# Patient Record
Sex: Male | Born: 1965 | Race: White | Hispanic: No | Smoking: Current every day smoker
Health system: Southern US, Community
[De-identification: ages and names within clinical notes are randomized; demographics above are authoritative.]

## PROBLEM LIST (undated history)

## (undated) DIAGNOSIS — K222 Esophageal obstruction: Secondary | ICD-10-CM

## (undated) DIAGNOSIS — Z860100 Personal history of colon polyps, unspecified: Secondary | ICD-10-CM

## (undated) DIAGNOSIS — B354 Tinea corporis: Secondary | ICD-10-CM

## (undated) DIAGNOSIS — IMO0002 Reserved for concepts with insufficient information to code with codable children: Secondary | ICD-10-CM

## (undated) DIAGNOSIS — R1032 Left lower quadrant pain: Secondary | ICD-10-CM

## (undated) DIAGNOSIS — F1120 Opioid dependence, uncomplicated: Secondary | ICD-10-CM

## (undated) DIAGNOSIS — K219 Gastro-esophageal reflux disease without esophagitis: Secondary | ICD-10-CM

## (undated) DIAGNOSIS — N400 Enlarged prostate without lower urinary tract symptoms: Secondary | ICD-10-CM

## (undated) DIAGNOSIS — N138 Other obstructive and reflux uropathy: Secondary | ICD-10-CM

## (undated) DIAGNOSIS — M545 Low back pain, unspecified: Secondary | ICD-10-CM

## (undated) DIAGNOSIS — G8929 Other chronic pain: Secondary | ICD-10-CM

## (undated) DIAGNOSIS — L0293 Carbuncle, unspecified: Secondary | ICD-10-CM

## (undated) DIAGNOSIS — Z83719 Family history of colon polyps, unspecified: Secondary | ICD-10-CM

## (undated) DIAGNOSIS — K2270 Barrett's esophagus without dysplasia: Secondary | ICD-10-CM

## (undated) DIAGNOSIS — K209 Esophagitis, unspecified without bleeding: Secondary | ICD-10-CM

## (undated) DIAGNOSIS — A09 Infectious gastroenteritis and colitis, unspecified: Secondary | ICD-10-CM

## (undated) DIAGNOSIS — R112 Nausea with vomiting, unspecified: Secondary | ICD-10-CM

## (undated) DIAGNOSIS — Z8371 Family history of colonic polyps: Secondary | ICD-10-CM

## (undated) DIAGNOSIS — B1710 Acute hepatitis C without hepatic coma: Secondary | ICD-10-CM

## (undated) DIAGNOSIS — F411 Generalized anxiety disorder: Secondary | ICD-10-CM

## (undated) DIAGNOSIS — I10 Essential (primary) hypertension: Secondary | ICD-10-CM

## (undated) DIAGNOSIS — J9383 Other pneumothorax: Secondary | ICD-10-CM

## (undated) DIAGNOSIS — R110 Nausea: Secondary | ICD-10-CM

## (undated) DIAGNOSIS — M1990 Unspecified osteoarthritis, unspecified site: Secondary | ICD-10-CM

## (undated) DIAGNOSIS — Z9289 Personal history of other medical treatment: Secondary | ICD-10-CM

## (undated) DIAGNOSIS — K2980 Duodenitis without bleeding: Secondary | ICD-10-CM

## (undated) DIAGNOSIS — F4310 Post-traumatic stress disorder, unspecified: Secondary | ICD-10-CM

## (undated) DIAGNOSIS — F1910 Other psychoactive substance abuse, uncomplicated: Secondary | ICD-10-CM

## (undated) DIAGNOSIS — R197 Diarrhea, unspecified: Secondary | ICD-10-CM

## (undated) DIAGNOSIS — N401 Enlarged prostate with lower urinary tract symptoms: Secondary | ICD-10-CM

## (undated) DIAGNOSIS — T148XXA Other injury of unspecified body region, initial encounter: Secondary | ICD-10-CM

## (undated) DIAGNOSIS — L0292 Furuncle, unspecified: Secondary | ICD-10-CM

## (undated) HISTORY — DX: Esophagitis, unspecified: K20.9

## (undated) HISTORY — DX: Benign prostatic hyperplasia with lower urinary tract symptoms: N40.1

## (undated) HISTORY — PX: REPAIR DURAL / CSF LEAK: SUR1169

## (undated) HISTORY — PX: LUMBAR DISC SURGERY: SHX700

## (undated) HISTORY — DX: Other obstructive and reflux uropathy: N13.8

## (undated) HISTORY — DX: Nausea: R11.0

## (undated) HISTORY — DX: Generalized anxiety disorder: F41.1

## (undated) HISTORY — PX: TONSILLECTOMY AND ADENOIDECTOMY: SUR1326

## (undated) HISTORY — DX: Left lower quadrant pain: R10.32

## (undated) HISTORY — PX: BACK SURGERY: SHX140

## (undated) HISTORY — DX: Infectious gastroenteritis and colitis, unspecified: A09

## (undated) HISTORY — DX: Esophageal obstruction: K22.2

## (undated) HISTORY — DX: Gastro-esophageal reflux disease without esophagitis: K21.9

## (undated) HISTORY — DX: Other psychoactive substance abuse, uncomplicated: F19.10

## (undated) HISTORY — DX: Duodenitis without bleeding: K29.80

## (undated) HISTORY — DX: Family history of colon polyps, unspecified: Z83.719

## (undated) HISTORY — DX: Benign prostatic hyperplasia without lower urinary tract symptoms: N40.0

## (undated) HISTORY — DX: Personal history of colonic polyps: Z86.010

## (undated) HISTORY — DX: Reserved for concepts with insufficient information to code with codable children: IMO0002

## (undated) HISTORY — DX: Acute hepatitis C without hepatic coma: B17.10

## (undated) HISTORY — DX: Nausea with vomiting, unspecified: R11.2

## (undated) HISTORY — DX: Carbuncle, unspecified: L02.93

## (undated) HISTORY — DX: Diarrhea, unspecified: R19.7

## (undated) HISTORY — DX: Esophagitis, unspecified without bleeding: K20.90

## (undated) HISTORY — DX: Furuncle, unspecified: L02.92

## (undated) HISTORY — PX: COLON SURGERY: SHX602

## (undated) HISTORY — DX: Tinea corporis: B35.4

## (undated) HISTORY — DX: Personal history of colon polyps, unspecified: Z86.0100

## (undated) HISTORY — DX: Barrett's esophagus without dysplasia: K22.70

## (undated) HISTORY — PX: FRACTURE SURGERY: SHX138

## (undated) HISTORY — DX: Family history of colonic polyps: Z83.71

---

## 1986-09-27 HISTORY — PX: RESECTION OF APICAL BLEB: SHX5078

## 1987-09-28 DIAGNOSIS — B1710 Acute hepatitis C without hepatic coma: Secondary | ICD-10-CM

## 1987-09-28 DIAGNOSIS — Z9289 Personal history of other medical treatment: Secondary | ICD-10-CM

## 1987-09-28 HISTORY — PX: EXPLORATORY LAPAROTOMY: SUR591

## 1987-09-28 HISTORY — DX: Personal history of other medical treatment: Z92.89

## 1987-09-28 HISTORY — PX: APPENDECTOMY: SHX54

## 1987-09-28 HISTORY — DX: Acute hepatitis C without hepatic coma: B17.10

## 1988-09-27 DIAGNOSIS — T148XXA Other injury of unspecified body region, initial encounter: Secondary | ICD-10-CM

## 1988-09-27 HISTORY — DX: Other injury of unspecified body region, initial encounter: T14.8XXA

## 1989-09-27 HISTORY — PX: ORIF TIBIAL SHAFT FRACTURE W/ PLATES AND SCREWS: SUR964

## 1998-02-22 ENCOUNTER — Emergency Department (HOSPITAL_COMMUNITY): Admission: EM | Admit: 1998-02-22 | Discharge: 1998-02-22 | Payer: Self-pay | Admitting: Emergency Medicine

## 1999-02-08 ENCOUNTER — Encounter: Payer: Self-pay | Admitting: *Deleted

## 1999-02-08 ENCOUNTER — Ambulatory Visit (HOSPITAL_COMMUNITY): Admission: RE | Admit: 1999-02-08 | Discharge: 1999-02-08 | Payer: Self-pay | Admitting: *Deleted

## 1999-02-26 ENCOUNTER — Emergency Department (HOSPITAL_COMMUNITY): Admission: EM | Admit: 1999-02-26 | Discharge: 1999-02-26 | Payer: Self-pay | Admitting: Emergency Medicine

## 1999-04-07 ENCOUNTER — Emergency Department (HOSPITAL_COMMUNITY): Admission: EM | Admit: 1999-04-07 | Discharge: 1999-04-07 | Payer: Self-pay | Admitting: Emergency Medicine

## 1999-04-07 ENCOUNTER — Encounter: Payer: Self-pay | Admitting: Emergency Medicine

## 1999-07-27 ENCOUNTER — Emergency Department (HOSPITAL_COMMUNITY): Admission: EM | Admit: 1999-07-27 | Discharge: 1999-07-27 | Payer: Self-pay | Admitting: Emergency Medicine

## 1999-07-27 ENCOUNTER — Encounter: Payer: Self-pay | Admitting: Emergency Medicine

## 1999-08-30 ENCOUNTER — Encounter: Payer: Self-pay | Admitting: Emergency Medicine

## 1999-08-30 ENCOUNTER — Emergency Department (HOSPITAL_COMMUNITY): Admission: EM | Admit: 1999-08-30 | Discharge: 1999-08-30 | Payer: Self-pay | Admitting: Emergency Medicine

## 1999-09-07 ENCOUNTER — Encounter: Payer: Self-pay | Admitting: Emergency Medicine

## 1999-09-07 ENCOUNTER — Emergency Department (HOSPITAL_COMMUNITY): Admission: EM | Admit: 1999-09-07 | Discharge: 1999-09-07 | Payer: Self-pay | Admitting: Emergency Medicine

## 1999-09-09 ENCOUNTER — Ambulatory Visit (HOSPITAL_COMMUNITY): Admission: RE | Admit: 1999-09-09 | Discharge: 1999-09-09 | Payer: Self-pay

## 2000-01-21 ENCOUNTER — Emergency Department (HOSPITAL_COMMUNITY): Admission: EM | Admit: 2000-01-21 | Discharge: 2000-01-21 | Payer: Self-pay | Admitting: Emergency Medicine

## 2000-01-21 ENCOUNTER — Encounter: Payer: Self-pay | Admitting: Emergency Medicine

## 2000-02-28 ENCOUNTER — Emergency Department (HOSPITAL_COMMUNITY): Admission: EM | Admit: 2000-02-28 | Discharge: 2000-02-28 | Payer: Self-pay | Admitting: Emergency Medicine

## 2000-02-28 ENCOUNTER — Encounter: Payer: Self-pay | Admitting: Emergency Medicine

## 2000-05-26 ENCOUNTER — Ambulatory Visit (HOSPITAL_COMMUNITY): Admission: RE | Admit: 2000-05-26 | Discharge: 2000-05-26 | Payer: Self-pay | Admitting: *Deleted

## 2000-05-26 ENCOUNTER — Encounter: Payer: Self-pay | Admitting: *Deleted

## 2000-06-26 ENCOUNTER — Emergency Department (HOSPITAL_COMMUNITY): Admission: EM | Admit: 2000-06-26 | Discharge: 2000-06-27 | Payer: Self-pay | Admitting: Emergency Medicine

## 2000-08-07 ENCOUNTER — Inpatient Hospital Stay (HOSPITAL_COMMUNITY): Admission: EM | Admit: 2000-08-07 | Discharge: 2000-08-08 | Payer: Self-pay | Admitting: Emergency Medicine

## 2000-08-07 ENCOUNTER — Encounter: Payer: Self-pay | Admitting: Emergency Medicine

## 2000-09-05 ENCOUNTER — Encounter: Payer: Self-pay | Admitting: *Deleted

## 2000-09-05 ENCOUNTER — Ambulatory Visit (HOSPITAL_COMMUNITY): Admission: RE | Admit: 2000-09-05 | Discharge: 2000-09-05 | Payer: Self-pay | Admitting: *Deleted

## 2000-09-18 ENCOUNTER — Emergency Department (HOSPITAL_COMMUNITY): Admission: EM | Admit: 2000-09-18 | Discharge: 2000-09-18 | Payer: Self-pay | Admitting: Emergency Medicine

## 2001-01-24 ENCOUNTER — Emergency Department (HOSPITAL_COMMUNITY): Admission: EM | Admit: 2001-01-24 | Discharge: 2001-01-24 | Payer: Self-pay | Admitting: *Deleted

## 2001-04-22 ENCOUNTER — Emergency Department (HOSPITAL_COMMUNITY): Admission: EM | Admit: 2001-04-22 | Discharge: 2001-04-22 | Payer: Self-pay

## 2001-04-23 ENCOUNTER — Emergency Department (HOSPITAL_COMMUNITY): Admission: EM | Admit: 2001-04-23 | Discharge: 2001-04-23 | Payer: Self-pay | Admitting: Emergency Medicine

## 2001-04-30 ENCOUNTER — Emergency Department (HOSPITAL_COMMUNITY): Admission: EM | Admit: 2001-04-30 | Discharge: 2001-05-01 | Payer: Self-pay

## 2001-06-25 ENCOUNTER — Ambulatory Visit (HOSPITAL_COMMUNITY): Admission: RE | Admit: 2001-06-25 | Discharge: 2001-06-25 | Payer: Self-pay | Admitting: *Deleted

## 2001-06-25 ENCOUNTER — Encounter: Payer: Self-pay | Admitting: *Deleted

## 2001-10-01 ENCOUNTER — Ambulatory Visit (HOSPITAL_COMMUNITY): Admission: RE | Admit: 2001-10-01 | Discharge: 2001-10-01 | Payer: Self-pay | Admitting: *Deleted

## 2001-10-01 ENCOUNTER — Encounter: Payer: Self-pay | Admitting: *Deleted

## 2002-02-28 ENCOUNTER — Inpatient Hospital Stay (HOSPITAL_COMMUNITY): Admission: AD | Admit: 2002-02-28 | Discharge: 2002-03-04 | Payer: Self-pay | Admitting: Orthopedic Surgery

## 2002-02-28 ENCOUNTER — Emergency Department (HOSPITAL_COMMUNITY): Admission: EM | Admit: 2002-02-28 | Discharge: 2002-02-28 | Payer: Self-pay | Admitting: Emergency Medicine

## 2002-02-28 ENCOUNTER — Encounter: Payer: Self-pay | Admitting: Internal Medicine

## 2002-03-25 ENCOUNTER — Encounter: Payer: Self-pay | Admitting: Emergency Medicine

## 2002-03-25 ENCOUNTER — Emergency Department (HOSPITAL_COMMUNITY): Admission: EM | Admit: 2002-03-25 | Discharge: 2002-03-25 | Payer: Self-pay | Admitting: Emergency Medicine

## 2002-04-08 ENCOUNTER — Emergency Department (HOSPITAL_COMMUNITY): Admission: EM | Admit: 2002-04-08 | Discharge: 2002-04-08 | Payer: Self-pay | Admitting: Emergency Medicine

## 2002-04-08 ENCOUNTER — Encounter: Payer: Self-pay | Admitting: Emergency Medicine

## 2002-04-21 ENCOUNTER — Emergency Department (HOSPITAL_COMMUNITY): Admission: EM | Admit: 2002-04-21 | Discharge: 2002-04-21 | Payer: Self-pay | Admitting: Emergency Medicine

## 2002-04-21 ENCOUNTER — Encounter: Payer: Self-pay | Admitting: Emergency Medicine

## 2002-04-28 ENCOUNTER — Emergency Department (HOSPITAL_COMMUNITY): Admission: EM | Admit: 2002-04-28 | Discharge: 2002-04-28 | Payer: Self-pay | Admitting: Emergency Medicine

## 2002-04-28 ENCOUNTER — Encounter: Payer: Self-pay | Admitting: Emergency Medicine

## 2002-04-30 ENCOUNTER — Emergency Department (HOSPITAL_COMMUNITY): Admission: EM | Admit: 2002-04-30 | Discharge: 2002-05-01 | Payer: Self-pay | Admitting: Emergency Medicine

## 2002-04-30 ENCOUNTER — Emergency Department (HOSPITAL_COMMUNITY): Admission: EM | Admit: 2002-04-30 | Discharge: 2002-04-30 | Payer: Self-pay | Admitting: Emergency Medicine

## 2002-08-27 ENCOUNTER — Encounter: Payer: Self-pay | Admitting: Family Medicine

## 2002-08-27 ENCOUNTER — Encounter: Admission: RE | Admit: 2002-08-27 | Discharge: 2002-08-27 | Payer: Self-pay | Admitting: Family Medicine

## 2003-01-21 ENCOUNTER — Emergency Department (HOSPITAL_COMMUNITY): Admission: EM | Admit: 2003-01-21 | Discharge: 2003-01-21 | Payer: Self-pay | Admitting: Emergency Medicine

## 2003-02-21 ENCOUNTER — Encounter: Admission: RE | Admit: 2003-02-21 | Discharge: 2003-03-08 | Payer: Self-pay | Admitting: Pediatrics

## 2003-10-08 ENCOUNTER — Emergency Department (HOSPITAL_COMMUNITY): Admission: EM | Admit: 2003-10-08 | Discharge: 2003-10-08 | Payer: Self-pay

## 2003-11-23 ENCOUNTER — Emergency Department (HOSPITAL_COMMUNITY): Admission: EM | Admit: 2003-11-23 | Discharge: 2003-11-23 | Payer: Self-pay | Admitting: Emergency Medicine

## 2004-05-02 ENCOUNTER — Emergency Department (HOSPITAL_COMMUNITY): Admission: EM | Admit: 2004-05-02 | Discharge: 2004-05-02 | Payer: Self-pay | Admitting: Emergency Medicine

## 2004-05-04 ENCOUNTER — Emergency Department (HOSPITAL_COMMUNITY): Admission: EM | Admit: 2004-05-04 | Discharge: 2004-05-04 | Payer: Self-pay | Admitting: Emergency Medicine

## 2004-05-14 ENCOUNTER — Emergency Department (HOSPITAL_COMMUNITY): Admission: EM | Admit: 2004-05-14 | Discharge: 2004-05-14 | Payer: Self-pay | Admitting: Emergency Medicine

## 2004-09-17 ENCOUNTER — Emergency Department (HOSPITAL_COMMUNITY): Admission: EM | Admit: 2004-09-17 | Discharge: 2004-09-18 | Payer: Self-pay | Admitting: Emergency Medicine

## 2004-09-18 ENCOUNTER — Emergency Department (HOSPITAL_COMMUNITY): Admission: EM | Admit: 2004-09-18 | Discharge: 2004-09-18 | Payer: Self-pay

## 2004-09-19 ENCOUNTER — Inpatient Hospital Stay (HOSPITAL_COMMUNITY): Admission: EM | Admit: 2004-09-19 | Discharge: 2004-09-23 | Payer: Self-pay

## 2004-09-25 ENCOUNTER — Emergency Department (HOSPITAL_COMMUNITY): Admission: EM | Admit: 2004-09-25 | Discharge: 2004-09-25 | Payer: Self-pay | Admitting: Emergency Medicine

## 2004-12-15 ENCOUNTER — Ambulatory Visit: Payer: Self-pay | Admitting: Internal Medicine

## 2005-01-04 ENCOUNTER — Ambulatory Visit (HOSPITAL_COMMUNITY): Admission: RE | Admit: 2005-01-04 | Discharge: 2005-01-04 | Payer: Self-pay | Admitting: Obstetrics and Gynecology

## 2005-01-04 ENCOUNTER — Encounter (INDEPENDENT_AMBULATORY_CARE_PROVIDER_SITE_OTHER): Payer: Self-pay | Admitting: *Deleted

## 2005-04-07 ENCOUNTER — Emergency Department (HOSPITAL_COMMUNITY): Admission: EM | Admit: 2005-04-07 | Discharge: 2005-04-07 | Payer: Self-pay | Admitting: Emergency Medicine

## 2005-04-08 ENCOUNTER — Emergency Department (HOSPITAL_COMMUNITY): Admission: EM | Admit: 2005-04-08 | Discharge: 2005-04-08 | Payer: Self-pay | Admitting: Emergency Medicine

## 2005-04-09 ENCOUNTER — Emergency Department (HOSPITAL_COMMUNITY): Admission: EM | Admit: 2005-04-09 | Discharge: 2005-04-10 | Payer: Self-pay | Admitting: Emergency Medicine

## 2005-04-11 ENCOUNTER — Emergency Department (HOSPITAL_COMMUNITY): Admission: EM | Admit: 2005-04-11 | Discharge: 2005-04-11 | Payer: Self-pay | Admitting: *Deleted

## 2005-04-15 ENCOUNTER — Emergency Department (HOSPITAL_COMMUNITY): Admission: EM | Admit: 2005-04-15 | Discharge: 2005-04-15 | Payer: Self-pay | Admitting: Emergency Medicine

## 2005-07-14 ENCOUNTER — Emergency Department (HOSPITAL_COMMUNITY): Admission: EM | Admit: 2005-07-14 | Discharge: 2005-07-14 | Payer: Self-pay | Admitting: Emergency Medicine

## 2005-09-06 ENCOUNTER — Emergency Department (HOSPITAL_COMMUNITY): Admission: EM | Admit: 2005-09-06 | Discharge: 2005-09-06 | Payer: Self-pay | Admitting: Emergency Medicine

## 2005-11-18 ENCOUNTER — Emergency Department (HOSPITAL_COMMUNITY): Admission: EM | Admit: 2005-11-18 | Discharge: 2005-11-18 | Payer: Self-pay | Admitting: Emergency Medicine

## 2006-01-05 ENCOUNTER — Ambulatory Visit: Payer: Self-pay | Admitting: Internal Medicine

## 2006-02-16 ENCOUNTER — Ambulatory Visit: Payer: Self-pay | Admitting: Infectious Diseases

## 2006-03-27 ENCOUNTER — Emergency Department (HOSPITAL_COMMUNITY): Admission: EM | Admit: 2006-03-27 | Discharge: 2006-03-27 | Payer: Self-pay | Admitting: Emergency Medicine

## 2006-04-05 ENCOUNTER — Ambulatory Visit: Payer: Self-pay | Admitting: Internal Medicine

## 2006-04-16 ENCOUNTER — Encounter: Payer: Self-pay | Admitting: Emergency Medicine

## 2006-04-17 ENCOUNTER — Observation Stay (HOSPITAL_COMMUNITY): Admission: EM | Admit: 2006-04-17 | Discharge: 2006-04-17 | Payer: Self-pay | Admitting: Internal Medicine

## 2006-04-19 ENCOUNTER — Ambulatory Visit: Payer: Self-pay | Admitting: Internal Medicine

## 2006-06-07 ENCOUNTER — Ambulatory Visit: Payer: Self-pay | Admitting: Internal Medicine

## 2006-07-12 ENCOUNTER — Ambulatory Visit: Payer: Self-pay | Admitting: Infectious Diseases

## 2006-07-12 LAB — CONVERTED CEMR LAB
Albumin: 4.7 g/dL (ref 3.5–5.2)
Creatinine, Ser: 0.79 mg/dL (ref 0.40–1.50)
Eosinophils Absolute: 0.3 cells/mcL (ref 0.0–0.7)
Glucose, Bld: 73 mg/dL (ref 70–99)
Leukocyte count, blood: 6.5 10*9/L (ref 4.0–10.5)
Lymphocytes Relative: 38 % (ref 12–46)
Lymphs Abs: 2.5 10*3/uL (ref 0.7–3.3)
MCV: 92.3 fL (ref 78.0–100.0)
Monocytes Relative: 10 % (ref 3–11)
Platelets: 234 10*3/uL (ref 150–400)
RBC: 5.06 M/uL (ref 4.22–5.81)
Sodium: 140 meq/L (ref 135–145)
Total Bilirubin: 0.4 mg/dL (ref 0.3–1.2)

## 2006-07-19 ENCOUNTER — Ambulatory Visit: Payer: Self-pay | Admitting: Internal Medicine

## 2006-08-01 DIAGNOSIS — B354 Tinea corporis: Secondary | ICD-10-CM | POA: Insufficient documentation

## 2006-08-01 DIAGNOSIS — L0292 Furuncle, unspecified: Secondary | ICD-10-CM | POA: Insufficient documentation

## 2006-08-01 DIAGNOSIS — L0293 Carbuncle, unspecified: Secondary | ICD-10-CM

## 2006-08-07 ENCOUNTER — Emergency Department (HOSPITAL_COMMUNITY): Admission: EM | Admit: 2006-08-07 | Discharge: 2006-08-07 | Payer: Self-pay | Admitting: Family Medicine

## 2006-08-09 ENCOUNTER — Emergency Department (HOSPITAL_COMMUNITY): Admission: EM | Admit: 2006-08-09 | Discharge: 2006-08-09 | Payer: Self-pay | Admitting: Family Medicine

## 2006-10-10 ENCOUNTER — Emergency Department (HOSPITAL_COMMUNITY): Admission: EM | Admit: 2006-10-10 | Discharge: 2006-10-10 | Payer: Self-pay | Admitting: Family Medicine

## 2006-12-08 ENCOUNTER — Ambulatory Visit: Payer: Self-pay | Admitting: Gastroenterology

## 2006-12-08 ENCOUNTER — Telehealth: Payer: Self-pay | Admitting: Infectious Diseases

## 2006-12-22 ENCOUNTER — Ambulatory Visit (HOSPITAL_COMMUNITY): Admission: RE | Admit: 2006-12-22 | Discharge: 2006-12-22 | Payer: Self-pay | Admitting: Gastroenterology

## 2006-12-22 ENCOUNTER — Encounter: Payer: Self-pay | Admitting: Gastroenterology

## 2006-12-22 DIAGNOSIS — K222 Esophageal obstruction: Secondary | ICD-10-CM

## 2006-12-22 DIAGNOSIS — K209 Esophagitis, unspecified without bleeding: Secondary | ICD-10-CM | POA: Insufficient documentation

## 2006-12-22 DIAGNOSIS — K2980 Duodenitis without bleeding: Secondary | ICD-10-CM | POA: Insufficient documentation

## 2006-12-27 ENCOUNTER — Ambulatory Visit: Payer: Self-pay | Admitting: Gastroenterology

## 2007-03-11 ENCOUNTER — Emergency Department (HOSPITAL_COMMUNITY): Admission: EM | Admit: 2007-03-11 | Discharge: 2007-03-11 | Payer: Self-pay | Admitting: Emergency Medicine

## 2007-03-13 ENCOUNTER — Emergency Department (HOSPITAL_COMMUNITY): Admission: EM | Admit: 2007-03-13 | Discharge: 2007-03-13 | Payer: Self-pay | Admitting: Emergency Medicine

## 2007-04-03 ENCOUNTER — Emergency Department (HOSPITAL_COMMUNITY): Admission: EM | Admit: 2007-04-03 | Discharge: 2007-04-03 | Payer: Self-pay | Admitting: Emergency Medicine

## 2007-04-05 ENCOUNTER — Emergency Department (HOSPITAL_COMMUNITY): Admission: EM | Admit: 2007-04-05 | Discharge: 2007-04-05 | Payer: Self-pay | Admitting: Emergency Medicine

## 2007-05-16 ENCOUNTER — Ambulatory Visit (HOSPITAL_BASED_OUTPATIENT_CLINIC_OR_DEPARTMENT_OTHER): Admission: RE | Admit: 2007-05-16 | Discharge: 2007-05-16 | Payer: Self-pay | Admitting: Orthopedic Surgery

## 2007-08-03 ENCOUNTER — Ambulatory Visit: Payer: Self-pay | Admitting: Gastroenterology

## 2007-10-03 ENCOUNTER — Ambulatory Visit: Payer: Self-pay | Admitting: Gastroenterology

## 2007-10-06 ENCOUNTER — Ambulatory Visit: Payer: Self-pay | Admitting: Gastroenterology

## 2007-12-26 ENCOUNTER — Ambulatory Visit: Payer: Self-pay | Admitting: Gastroenterology

## 2008-02-01 ENCOUNTER — Encounter: Admission: RE | Admit: 2008-02-01 | Discharge: 2008-03-04 | Payer: Self-pay | Admitting: Anesthesiology

## 2008-03-19 ENCOUNTER — Encounter
Admission: RE | Admit: 2008-03-19 | Discharge: 2008-06-17 | Payer: Self-pay | Admitting: Physical Medicine & Rehabilitation

## 2008-03-22 ENCOUNTER — Ambulatory Visit: Payer: Self-pay | Admitting: Physical Medicine & Rehabilitation

## 2008-03-26 ENCOUNTER — Emergency Department (HOSPITAL_COMMUNITY): Admission: EM | Admit: 2008-03-26 | Discharge: 2008-03-26 | Payer: Self-pay | Admitting: Emergency Medicine

## 2008-04-08 ENCOUNTER — Telehealth: Payer: Self-pay | Admitting: Gastroenterology

## 2008-04-19 ENCOUNTER — Ambulatory Visit: Payer: Self-pay | Admitting: Physical Medicine & Rehabilitation

## 2008-04-30 ENCOUNTER — Encounter: Admission: RE | Admit: 2008-04-30 | Discharge: 2008-04-30 | Payer: Self-pay | Admitting: Internal Medicine

## 2008-05-07 ENCOUNTER — Telehealth: Payer: Self-pay | Admitting: Gastroenterology

## 2008-05-17 ENCOUNTER — Ambulatory Visit: Payer: Self-pay | Admitting: Physical Medicine & Rehabilitation

## 2008-05-31 ENCOUNTER — Ambulatory Visit: Payer: Self-pay | Admitting: Physical Medicine & Rehabilitation

## 2008-06-11 ENCOUNTER — Telehealth: Payer: Self-pay | Admitting: Gastroenterology

## 2008-06-18 ENCOUNTER — Telehealth: Payer: Self-pay | Admitting: Gastroenterology

## 2008-06-27 ENCOUNTER — Encounter
Admission: RE | Admit: 2008-06-27 | Discharge: 2008-09-25 | Payer: Self-pay | Admitting: Physical Medicine & Rehabilitation

## 2008-06-28 ENCOUNTER — Ambulatory Visit: Payer: Self-pay | Admitting: Physical Medicine & Rehabilitation

## 2008-07-02 ENCOUNTER — Ambulatory Visit: Payer: Self-pay | Admitting: Gastroenterology

## 2008-07-26 ENCOUNTER — Ambulatory Visit: Payer: Self-pay | Admitting: Physical Medicine & Rehabilitation

## 2008-08-07 ENCOUNTER — Ambulatory Visit: Payer: Self-pay | Admitting: Pain Medicine

## 2008-08-13 ENCOUNTER — Emergency Department (HOSPITAL_COMMUNITY): Admission: EM | Admit: 2008-08-13 | Discharge: 2008-08-13 | Payer: Self-pay | Admitting: Emergency Medicine

## 2008-08-20 ENCOUNTER — Ambulatory Visit: Payer: Self-pay | Admitting: Physical Medicine & Rehabilitation

## 2008-09-17 ENCOUNTER — Ambulatory Visit: Payer: Self-pay | Admitting: Physical Medicine & Rehabilitation

## 2008-10-14 ENCOUNTER — Encounter
Admission: RE | Admit: 2008-10-14 | Discharge: 2009-01-12 | Payer: Self-pay | Admitting: Physical Medicine & Rehabilitation

## 2008-10-15 ENCOUNTER — Ambulatory Visit: Payer: Self-pay | Admitting: Physical Medicine & Rehabilitation

## 2008-10-15 ENCOUNTER — Telehealth: Payer: Self-pay | Admitting: Gastroenterology

## 2008-11-15 ENCOUNTER — Telehealth: Payer: Self-pay | Admitting: Gastroenterology

## 2008-11-29 ENCOUNTER — Telehealth: Payer: Self-pay | Admitting: Gastroenterology

## 2008-12-11 DIAGNOSIS — B1710 Acute hepatitis C without hepatic coma: Secondary | ICD-10-CM | POA: Insufficient documentation

## 2008-12-13 ENCOUNTER — Ambulatory Visit: Payer: Self-pay | Admitting: Gastroenterology

## 2008-12-13 DIAGNOSIS — K219 Gastro-esophageal reflux disease without esophagitis: Secondary | ICD-10-CM | POA: Insufficient documentation

## 2008-12-18 ENCOUNTER — Emergency Department (HOSPITAL_COMMUNITY): Admission: EM | Admit: 2008-12-18 | Discharge: 2008-12-18 | Payer: Self-pay | Admitting: Emergency Medicine

## 2008-12-19 ENCOUNTER — Telehealth (INDEPENDENT_AMBULATORY_CARE_PROVIDER_SITE_OTHER): Payer: Self-pay | Admitting: *Deleted

## 2008-12-19 ENCOUNTER — Emergency Department (HOSPITAL_COMMUNITY): Admission: EM | Admit: 2008-12-19 | Discharge: 2008-12-19 | Payer: Self-pay | Admitting: Emergency Medicine

## 2009-01-01 ENCOUNTER — Telehealth: Payer: Self-pay | Admitting: Gastroenterology

## 2009-04-16 ENCOUNTER — Telehealth: Payer: Self-pay | Admitting: Gastroenterology

## 2009-06-03 ENCOUNTER — Ambulatory Visit: Payer: Self-pay | Admitting: Gastroenterology

## 2009-06-03 ENCOUNTER — Telehealth: Payer: Self-pay | Admitting: Gastroenterology

## 2009-06-03 ENCOUNTER — Ambulatory Visit: Payer: Self-pay | Admitting: Cardiology

## 2009-06-03 DIAGNOSIS — R112 Nausea with vomiting, unspecified: Secondary | ICD-10-CM

## 2009-06-03 DIAGNOSIS — R1032 Left lower quadrant pain: Secondary | ICD-10-CM | POA: Insufficient documentation

## 2009-06-03 DIAGNOSIS — R197 Diarrhea, unspecified: Secondary | ICD-10-CM

## 2009-06-03 DIAGNOSIS — F411 Generalized anxiety disorder: Secondary | ICD-10-CM | POA: Insufficient documentation

## 2009-06-03 DIAGNOSIS — R110 Nausea: Secondary | ICD-10-CM

## 2009-06-03 DIAGNOSIS — A09 Infectious gastroenteritis and colitis, unspecified: Secondary | ICD-10-CM | POA: Insufficient documentation

## 2009-06-04 LAB — CONVERTED CEMR LAB
ALT: 60 units/L — ABNORMAL HIGH (ref 0–53)
AST: 59 units/L — ABNORMAL HIGH (ref 0–37)
CO2: 28 meq/L (ref 19–32)
Calcium: 9 mg/dL (ref 8.4–10.5)
Chloride: 107 meq/L (ref 96–112)
Creatinine, Ser: 0.8 mg/dL (ref 0.4–1.5)
Eosinophils Absolute: 0.4 10*3/uL (ref 0.0–0.7)
GFR calc non Af Amer: 111.69 mL/min (ref >60)
Glucose, Bld: 83 mg/dL (ref 70–99)
HCT: 35.6 % — ABNORMAL LOW (ref 39.0–52.0)
Hemoglobin, Urine: NEGATIVE
Hemoglobin: 12.3 g/dL — ABNORMAL LOW (ref 13.0–17.0)
Lymphs Abs: 1.9 10*3/uL (ref 0.7–4.0)
MCV: 97.5 fL (ref 78.0–100.0)
Monocytes Absolute: 0.6 10*3/uL (ref 0.1–1.0)
Neutrophils Relative %: 46 % (ref 43.0–77.0)
Potassium: 4.3 meq/L (ref 3.5–5.1)
RDW: 12.2 % (ref 11.5–14.6)
Total Bilirubin: 0.5 mg/dL (ref 0.3–1.2)
pH: 6 (ref 5.0–8.0)

## 2009-06-16 ENCOUNTER — Ambulatory Visit: Payer: Self-pay | Admitting: Gastroenterology

## 2009-06-19 ENCOUNTER — Emergency Department (HOSPITAL_COMMUNITY): Admission: EM | Admit: 2009-06-19 | Discharge: 2009-06-19 | Payer: Self-pay | Admitting: Emergency Medicine

## 2009-10-06 ENCOUNTER — Telehealth: Payer: Self-pay | Admitting: Gastroenterology

## 2009-11-03 ENCOUNTER — Ambulatory Visit: Payer: Self-pay | Admitting: Gastroenterology

## 2009-11-03 DIAGNOSIS — N400 Enlarged prostate without lower urinary tract symptoms: Secondary | ICD-10-CM

## 2009-11-03 DIAGNOSIS — N401 Enlarged prostate with lower urinary tract symptoms: Secondary | ICD-10-CM

## 2009-11-03 LAB — CONVERTED CEMR LAB
Hemoglobin, Urine: NEGATIVE
Leukocytes, UA: NEGATIVE
Nitrite: NEGATIVE
PSA: 0.32 ng/mL (ref 0.10–4.00)
Specific Gravity, Urine: >=1.03 (ref 1.000–1.030)
Urine Glucose: NEGATIVE mg/dL
Urobilinogen, UA: 1 (ref 0.0–1.0)

## 2009-11-04 ENCOUNTER — Ambulatory Visit: Payer: Self-pay | Admitting: Gastroenterology

## 2009-11-07 ENCOUNTER — Encounter: Payer: Self-pay | Admitting: Gastroenterology

## 2010-01-29 ENCOUNTER — Telehealth: Payer: Self-pay | Admitting: Gastroenterology

## 2010-02-04 ENCOUNTER — Emergency Department (HOSPITAL_COMMUNITY): Admission: EM | Admit: 2010-02-04 | Discharge: 2010-02-04 | Payer: Self-pay | Admitting: Emergency Medicine

## 2010-02-16 ENCOUNTER — Telehealth: Payer: Self-pay | Admitting: Gastroenterology

## 2010-04-10 ENCOUNTER — Telehealth: Payer: Self-pay | Admitting: Gastroenterology

## 2010-04-16 ENCOUNTER — Emergency Department (HOSPITAL_COMMUNITY): Admission: EM | Admit: 2010-04-16 | Discharge: 2010-04-16 | Payer: Self-pay | Admitting: Emergency Medicine

## 2010-05-12 ENCOUNTER — Telehealth: Payer: Self-pay | Admitting: Gastroenterology

## 2010-06-02 ENCOUNTER — Emergency Department (HOSPITAL_COMMUNITY): Admission: EM | Admit: 2010-06-02 | Discharge: 2010-06-03 | Payer: Self-pay | Admitting: Emergency Medicine

## 2010-06-22 ENCOUNTER — Telehealth: Payer: Self-pay | Admitting: Gastroenterology

## 2010-07-18 ENCOUNTER — Inpatient Hospital Stay (HOSPITAL_COMMUNITY)
Admission: EM | Admit: 2010-07-18 | Discharge: 2010-07-22 | Payer: Self-pay | Source: Home / Self Care | Admitting: Emergency Medicine

## 2010-08-17 ENCOUNTER — Inpatient Hospital Stay (HOSPITAL_COMMUNITY): Admission: EM | Admit: 2010-08-17 | Discharge: 2010-08-20 | Payer: Self-pay | Admitting: Emergency Medicine

## 2010-08-18 DIAGNOSIS — F29 Unspecified psychosis not due to a substance or known physiological condition: Secondary | ICD-10-CM

## 2010-08-22 ENCOUNTER — Emergency Department (HOSPITAL_COMMUNITY): Admission: EM | Admit: 2010-08-22 | Discharge: 2010-08-22 | Payer: Self-pay | Admitting: Emergency Medicine

## 2010-09-04 ENCOUNTER — Telehealth: Payer: Self-pay | Admitting: Gastroenterology

## 2010-09-17 ENCOUNTER — Emergency Department (HOSPITAL_COMMUNITY)
Admission: EM | Admit: 2010-09-17 | Discharge: 2010-09-17 | Payer: Self-pay | Source: Home / Self Care | Admitting: Emergency Medicine

## 2010-10-17 ENCOUNTER — Encounter: Payer: Self-pay | Admitting: Gastroenterology

## 2010-10-23 ENCOUNTER — Telehealth: Payer: Self-pay | Admitting: Gastroenterology
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
## 2010-10-27 NOTE — Letter (Signed)
Summary: Patient Notice-[facility]  [address] [place], [place] [address]   Phone: [phone]  Fax: [phone]        November 07, 2009 MRN: [id number]    [facility] [address] [place], [place]  [address]    Dear Mr. KORN,  I am pleased to inform you that the biopsies taken during your recent endoscopic examination did not show any evidence of cancer upon pathologic examination.  However, your biopsies indicate you have a condition known as Barrett's esophagus. While not cancer, it is pre-cancerous (can progress to cancer) and needs to be monitored with repeat endoscopic examination and biopsies.  Fortunately, it is quite rare that this develops into cancer, but careful monitoring of the condition along with taking your medication as prescribed is important in reducing the risk of developing cancer.  It is my recommendation that you have a repeat upper gastrointestinal endoscopic examination in _1 years.  Additional information/recommendations:  __Please call [phone] to schedule a return visit to further      evaluate your condition.  _x_Continue with treatment plan as outlined the day of your exam.  Please call us if you have or develop heartburn, reflux symptoms, any swallowing problems, or if you have questions about your condition that have not been fully answered at this time.  Sincerely,  Louis Meckel MD  This letter has been electronically signed by your physician.  Appended Document: Patient Notice-[facility] Letter mailed 2.15.11

## 2010-10-27 NOTE — Progress Notes (Signed)
Summary: Medication refill  Medications Added PROMETHAZINE HCL 25 MG TABS (PROMETHAZINE HCL) 1 tablet by mouth qid       Phone Note Call from Patient Call back at Home Phone [phone]   Caller: Patient Call For: Dr. Arlyce Dice Reason for Call: Talk to Nurse Summary of Call: Needs a refill on his phenegan Initial call taken by: Karna Christmas,  June 22, 2010 4:11 PM  Follow-up for Phone Call        Called pt to infrom medication sent to pharmacy Follow-up by: Merri Ray CMA Duncan Dull),  June 22, 2010 4:35 PM    New/Updated Medications: PROMETHAZINE HCL 25 MG TABS (PROMETHAZINE HCL) 1 tablet by mouth qid Prescriptions: PROMETHAZINE HCL 25 MG TABS (PROMETHAZINE HCL) 1 tablet by mouth qid  #120 x 0   Entered by:   Merri Ray CMA (AAMA)   Authorized by:   Louis Meckel MD   Signed by:   Merri Ray CMA (AAMA) on 06/22/2010   Method used:   Electronically to        [facility]. [phone]* (retail)       [address]       [place], [place]  [address]       Ph: [phone]       Fax: [phone]   RxID:   [id number]

## 2010-10-27 NOTE — Progress Notes (Signed)
Summary: samples   Phone Note Call from Patient Call back at Work Phone [phone]   Caller: Patient Call For: Dr Arlyce Dice Reason for Call: Talk to Nurse Summary of Call: Patient would like samples of Nexium Initial call taken by: Tawni Levy,  May 12, 2010 1:00 PM  Follow-up for Phone Call        Pt needs samples insurance will not pay for nexium, left samples out front for pt. Follow-up by: Merri Ray CMA Duncan Dull),  May 12, 2010 3:02 PM

## 2010-10-27 NOTE — Progress Notes (Signed)
Summary: Talk to nurse   Phone Note Call from Patient Call back at Home Phone [phone]   Call For: Dr Arlyce Dice Reason for Call: Refill Medication Summary of Call: Needs to talk to nurse briefly. Initial call taken by: Leanor Kail [facility],  Jan 29, 2010 10:07 AM  Follow-up for Phone Call        Pt stated he was short on money this month and can not afford his Nexium, wants to know if he can have samples. Told pt to come pick up today Follow-up by: Merri Ray CMA Duncan Dull),  Jan 29, 2010 1:57 PM

## 2010-10-27 NOTE — Letter (Signed)
Summary: EGD Instructions  [facility]  [address] [place], [place] [address]   Phone: [phone]  Fax: [phone]       Charles [name]    [birth year]    MRN: [id number]       Procedure Day /Date:TUESDAY 11/04/2009     Arrival Time: 2:00PM     Procedure Time:3:00PM     Location of Procedure:                    X  [facility] (4th Floor)   PREPARATION FOR ENDOSCOPY/DIL   On 11/04/2009 THE DAY OF THE PROCEDURE:  1.   No solid foods, milk or milk products are allowed after midnight the night before your procedure.  2.   Do not drink anything colored red or purple.  Avoid juices with pulp.  No orange juice.  3.  You may drink clear liquids until 1PM  which is 2 hours before your procedure.                                                                                                CLEAR LIQUIDS INCLUDE: Water Jello Ice Popsicles Tea (sugar ok, no milk/cream) Powdered fruit flavored drinks Coffee (sugar ok, no milk/cream) Gatorade Juice: apple, white grape, white cranberry  Lemonade Clear bullion, consomm, broth Carbonated beverages (any kind) Strained chicken noodle soup Hard Candy   MEDICATION INSTRUCTIONS  Unless otherwise instructed, you should take regular prescription medications with a small sip of water as early as possible the morning of your procedure.            OTHER INSTRUCTIONS  You will need a responsible adult at least 46 years of age to accompany you and drive you home.   This person must remain in the waiting room during your procedure.  Wear loose fitting clothing that is easily removed.  Leave jewelry and other valuables at home.  However, you may wish to bring a book to read or an iPod/MP3 player to listen to music as you wait for your procedure to start.  Remove all body piercing jewelry and leave at home.  Total time from sign-in until discharge is approximately 2-3 hours.  You should go home  directly after your procedure and rest.  You can resume normal activities the day after your procedure.  The day of your procedure you should not:   Drive   Make legal decisions   Operate machinery   Drink alcohol   Return to work  You will receive specific instructions about eating, activities and medications before you leave.    The above instructions have been reviewed and explained to me by   _______________________    I fully understand and can verbalize these instructions _____________________________ Date _________

## 2010-10-27 NOTE — Progress Notes (Signed)
Summary: samples   Phone Note Call from Patient Call back at Home Phone [phone] Call back at Work Phone [phone]   Caller: Patient Call For: Arlyce Dice Reason for Call: Talk to Nurse Summary of Call: Patient would like some Nexium samples Initial call taken by: Tawni Levy,  April 10, 2010 10:47 AM  Follow-up for Phone Call        Left samples out front for pt to pick up Follow-up by: Merri Ray CMA Duncan Dull),  April 10, 2010 11:16 AM

## 2010-10-27 NOTE — Progress Notes (Signed)
Summary: rx refill  Medications Added PROMETHAZINE HCL 25 MG TABS (PROMETHAZINE HCL) 1 tablet by mouth qid       Phone Note Call from Patient Call back at Home Phone [phone]   Caller: Patient Call For: Arlyce Dice Reason for Call: Talk to Nurse Summary of Call: Patient needs his rx called in to [facility] for his Phernegen until his appt 11-03-09 Initial call taken by: Tawni Levy [facility],  October 06, 2009 3:40 PM    New/Updated Medications: PROMETHAZINE HCL 25 MG TABS (PROMETHAZINE HCL) 1 tablet by mouth qid Prescriptions: PROMETHAZINE HCL 25 MG TABS (PROMETHAZINE HCL) 1 tablet by mouth qid  #120 x 0   Entered by:   Merri Ray CMA (AAMA)   Authorized by:   Louis Meckel MD   Signed by:   Merri Ray CMA (AAMA) on 10/06/2009   Method used:   Electronically to        [facility] [phone]* (retail)       [address]       [place], [place]  [address]       Ph: [phone]       Fax: [phone]   RxID:   [id number]

## 2010-10-27 NOTE — Procedures (Signed)
Summary: Upper Endoscopy w/DIL  Patient: Charles [name] Note: All result statuses are Final unless otherwise noted.  Tests: (1) Upper Endoscopy w/DIL (UED)  UED Upper Endoscopy w/DIL                             DONE     [facility]     [address]     [place], [place]  [address]           ENDOSCOPY PROCEDURE REPORT           PATIENT:  Charles [name], Charles [name]  MR#:  [id number]     BIRTHDATE:  1965-10-31, 43 yrs. old  GENDER:  male           ENDOSCOPIST:  Barbette Hair. Arlyce Dice, MD     ASSISTANT:           PROCEDURE DATE:  11/04/2009     PROCEDURE:  EGD with biopsy, Elease Hashimoto Dilation of the Esophagus     ASA CLASS:  Class II     INDICATIONS:  1) dysphagia           MEDICATIONS:   Fentanyl 125 mcg IV, Versed 10 mg IV, Benadryl 50     mg IV, glycopyrrolate (Robinal) 0.2 mg IV, 0.6cc simethancone 0.6     cc PO     TOPICAL ANESTHETIC:  Exactacain Spray           DESCRIPTION OF PROCEDURE:   After the risks benefits and     alternatives of the procedure were thoroughly explained, informed     consent was obtained.  The LB [id number] endoscope was     introduced through the mouth and advanced to the third portion of     the duodenum, without limitations.  The instrument was slowly     withdrawn as the mucosa was carefully examined.     <<PROCEDUREIMAGES>>     Irregular z-line at the gastroesophageal junction (see image3 and     image2). Bxs taken to r/o Barrett's esophagus  A stricture was     found at the gastroesophageal junction. Early esophageal stricture     The examination was otherwise normal.    Dilation was then     performed at the gastroesphageal junction           1) Dilator:  Maloney  Size(s):  18     Resistance:  moderate  Heme:  none     Appearance:           COMPLICATIONS:  None           ENDOSCOPIC IMPRESSION:     1) Irregular z-line at the gastroesophageal junction - r/o     Barrett's esophagus     2) Stricture at the gastroesophageal junction - s/p  dilitation     3) Otherwise normal examination.     RECOMMENDATIONS:     1) await biopsy results     2) continue PPI     3) call office to schedule an office visit for 1 month           REPEAT EXAM:  No           cc: Dr. Lanora Manis Dewy           ______________________________     Barbette Hair. Arlyce Dice, MD           CC:  n.     eSIGNED:   Barbette Hair. Marcele Kosta at 11/04/2009 03:39 PM           Charles [name], [id number]  Note: An exclamation mark (!) indicates a result that was not dispersed into the flowsheet. Document Creation Date: 11/05/2009 10:18 AM _______________________________________________________________________  (1) Order result status: Final Collection or observation date-time: 11/04/2009 15:32 Requested date-time:  Receipt date-time:  Reported date-time:  Referring Physician:   Ordering Physician: Melvia Heaps [phone]) Specimen Source:  Source: Launa Grill Order Number: [id number] Lab site:   Appended Document: Upper Endoscopy w/DIL     Procedures Next Due Date:    EGD: 10/2010

## 2010-10-27 NOTE — Progress Notes (Signed)
Summary: Samples   Phone Note Call from Patient Call back at Home Phone [phone]   Caller: Patient Call For: Dr. Arlyce Dice Reason for Call: Talk to Nurse Summary of Call: Asking for samples of Nexium Initial call taken by: Karna Christmas,  September 04, 2010 9:40 AM  Follow-up for Phone Call        Returned pts call. Spoke with pt. L/M for pt out front Follow-up by: Merri Ray CMA Duncan Dull),  September 04, 2010 10:29 AM

## 2010-10-27 NOTE — Progress Notes (Signed)
Summary: Question about phenegan   Phone Note Call from Patient Call back at Home Phone [phone]   Call For: Dr Arlyce Dice Reason for Call: Refill Medication Summary of Call: Question about his phenegan Initial call taken by: Leanor Kail [facility],  Feb 16, 2010 11:51 AM  Follow-up for Phone Call        Wants new refill of phenergan Follow-up by: Merri Ray CMA Duncan Dull),  Feb 16, 2010 1:08 PM    Prescriptions: PROMETHAZINE HCL 25 MG TABS (PROMETHAZINE HCL) 1 tablet by mouth qid  #120 x 1   Entered by:   Merri Ray CMA (AAMA)   Authorized by:   Louis Meckel MD   Signed by:   Merri Ray CMA (AAMA) on 02/16/2010   Method used:   Electronically to        [facility] [phone]* (retail)       [address]       [place], [place]  [address]       Ph: [phone]       Fax: [phone]   RxID:   [id number]

## 2010-10-29 NOTE — Progress Notes (Signed)
Summary: samples   Phone Note Call from Patient Call back at Home Phone [phone]   Caller: Patient Call For: Dr Arlyce Dice Reason for Call: Talk to Nurse Summary of Call: Patient would like samples of Nexium Initial call taken by: Tawni Levy,  October 23, 2010 3:33 PM  Follow-up for Phone Call        Called pt to inform he could pick up samples today Follow-up by: Merri Ray CMA Duncan Dull),  October 23, 2010 3:52 PM

## 2010-11-23 ENCOUNTER — Emergency Department (HOSPITAL_COMMUNITY)
Admission: EM | Admit: 2010-11-23 | Discharge: 2010-11-23 | Disposition: A | Discharge status: Home / Self Care | Payer: Medicaid Other | Attending: Emergency Medicine | Admitting: Emergency Medicine

## 2010-11-23 DIAGNOSIS — L089 Local infection of the skin and subcutaneous tissue, unspecified: Secondary | ICD-10-CM | POA: Insufficient documentation

## 2010-11-23 DIAGNOSIS — E7800 Pure hypercholesterolemia, unspecified: Secondary | ICD-10-CM | POA: Insufficient documentation

## 2010-11-23 DIAGNOSIS — B1920 Unspecified viral hepatitis C without hepatic coma: Secondary | ICD-10-CM | POA: Insufficient documentation

## 2010-11-23 DIAGNOSIS — M549 Dorsalgia, unspecified: Secondary | ICD-10-CM | POA: Insufficient documentation

## 2010-11-23 DIAGNOSIS — F29 Unspecified psychosis not due to a substance or known physiological condition: Secondary | ICD-10-CM | POA: Insufficient documentation

## 2010-11-23 DIAGNOSIS — F411 Generalized anxiety disorder: Secondary | ICD-10-CM | POA: Insufficient documentation

## 2010-11-23 DIAGNOSIS — G8929 Other chronic pain: Secondary | ICD-10-CM | POA: Insufficient documentation

## 2010-11-23 DIAGNOSIS — K219 Gastro-esophageal reflux disease without esophagitis: Secondary | ICD-10-CM | POA: Insufficient documentation

## 2010-11-23 DIAGNOSIS — I10 Essential (primary) hypertension: Secondary | ICD-10-CM | POA: Insufficient documentation

## 2010-12-07 LAB — DIFFERENTIAL
Basophils Absolute: 0 10*3/uL (ref 0.0–0.1)
Basophils Relative: 1 % (ref 0–1)
Eosinophils Absolute: 0.3 10*3/uL (ref 0.0–0.7)
Eosinophils Relative: 5 % (ref 0–5)
Lymphocytes Relative: 42 % (ref 12–46)
Lymphs Abs: 2.9 10*3/uL (ref 0.7–4.0)
Neutro Abs: 2.7 10*3/uL (ref 1.7–7.7)

## 2010-12-07 LAB — COMPREHENSIVE METABOLIC PANEL
AST: 46 U/L — ABNORMAL HIGH (ref 0–37)
Calcium: 9.3 mg/dL (ref 8.4–10.5)
Creatinine, Ser: 1.2 mg/dL (ref 0.4–1.5)
GFR calc Af Amer: >60 mL/min (ref >60)
GFR calc non Af Amer: >60 mL/min (ref >60)
Total Bilirubin: 0.6 mg/dL (ref 0.3–1.2)
Total Protein: 6.9 g/dL (ref 6.0–8.3)

## 2010-12-07 LAB — CBC
HCT: 41.7 % (ref 39.0–52.0)
Hemoglobin: 13.7 g/dL (ref 13.0–17.0)
MCH: 27.5 pg (ref 26.0–34.0)
MCHC: 32.9 g/dL (ref 30.0–36.0)
MCV: 83.7 fL (ref 78.0–100.0)
RBC: 4.98 MIL/uL (ref 4.22–5.81)
RDW: 14.8 % (ref 11.5–15.5)

## 2010-12-07 LAB — URINALYSIS, ROUTINE W REFLEX MICROSCOPIC
Bilirubin Urine: NEGATIVE
Glucose, UA: NEGATIVE mg/dL
Nitrite: NEGATIVE
Urobilinogen, UA: 0.2 mg/dL (ref 0.0–1.0)

## 2010-12-07 LAB — RAPID URINE DRUG SCREEN, HOSP PERFORMED
Amphetamines: NOT DETECTED
Barbiturates: NOT DETECTED
Benzodiazepines: POSITIVE — AB
Cocaine: NOT DETECTED

## 2010-12-07 LAB — ACETAMINOPHEN LEVEL: Acetaminophen (Tylenol), Serum: <10 ug/mL — ABNORMAL LOW (ref 10–30)

## 2010-12-07 LAB — SALICYLATE LEVEL: Salicylate Lvl: <4 mg/dL (ref 2.8–20.0)

## 2010-12-08 LAB — CBC
HCT: 32.1 % — ABNORMAL LOW (ref 39.0–52.0)
HCT: 32.3 % — ABNORMAL LOW (ref 39.0–52.0)
HCT: 33.5 % — ABNORMAL LOW (ref 39.0–52.0)
Hemoglobin: 10.7 g/dL — ABNORMAL LOW (ref 13.0–17.0)
Hemoglobin: 10.8 g/dL — ABNORMAL LOW (ref 13.0–17.0)
MCH: 27.8 pg (ref 26.0–34.0)
MCHC: 33.2 g/dL (ref 30.0–36.0)
MCHC: 33.5 g/dL (ref 30.0–36.0)
MCV: 82.9 fL (ref 78.0–100.0)
RDW: 13.7 % (ref 11.5–15.5)
WBC: 6.5 10*3/uL (ref 4.0–10.5)

## 2010-12-08 LAB — FERRITIN: Ferritin: 174 ng/mL (ref 22–322)

## 2010-12-08 LAB — COMPREHENSIVE METABOLIC PANEL
ALT: 25 U/L (ref 0–53)
BUN: 11 mg/dL (ref 6–23)
CO2: 24 mEq/L (ref 19–32)
Calcium: 8.4 mg/dL (ref 8.4–10.5)
Creatinine, Ser: 0.86 mg/dL (ref 0.4–1.5)
GFR calc non Af Amer: >60 mL/min (ref >60)
Glucose, Bld: 74 mg/dL (ref 70–99)
Sodium: 139 mEq/L (ref 135–145)

## 2010-12-08 LAB — BASIC METABOLIC PANEL
Creatinine, Ser: 0.95 mg/dL (ref 0.4–1.5)
GFR calc non Af Amer: >60 mL/min (ref >60)
GFR calc non Af Amer: >60 mL/min (ref >60)
Glucose, Bld: 92 mg/dL (ref 70–99)
Potassium: 4.2 mEq/L (ref 3.5–5.1)
Sodium: 137 mEq/L (ref 135–145)
Sodium: 137 mEq/L (ref 135–145)

## 2010-12-08 LAB — DIFFERENTIAL
Eosinophils Absolute: 0.4 10*3/uL (ref 0.0–0.7)
Eosinophils Absolute: 0.4 10*3/uL (ref 0.0–0.7)
Eosinophils Relative: 7 % — ABNORMAL HIGH (ref 0–5)
Lymphs Abs: 1.6 10*3/uL (ref 0.7–4.0)
Lymphs Abs: 1.7 10*3/uL (ref 0.7–4.0)
Monocytes Relative: 14 % — ABNORMAL HIGH (ref 3–12)
Neutro Abs: 2 10*3/uL (ref 1.7–7.7)
Neutrophils Relative %: 42 % — ABNORMAL LOW (ref 43–77)

## 2010-12-08 LAB — RAPID URINE DRUG SCREEN, HOSP PERFORMED
Barbiturates: NOT DETECTED
Benzodiazepines: POSITIVE — AB
Cocaine: NOT DETECTED
Opiates: NOT DETECTED
Tetrahydrocannabinol: NOT DETECTED

## 2010-12-08 LAB — RETICULOCYTES
RBC.: 3.94 MIL/uL — ABNORMAL LOW (ref 4.22–5.81)
Retic Count, Absolute: 51.2 10*3/uL (ref 19.0–186.0)

## 2010-12-08 LAB — IRON AND TIBC
Iron: 55 ug/dL (ref 42–135)
TIBC: 263 ug/dL (ref 215–435)

## 2010-12-09 LAB — URINALYSIS, ROUTINE W REFLEX MICROSCOPIC
Bilirubin Urine: NEGATIVE
Glucose, UA: NEGATIVE mg/dL
Hgb urine dipstick: NEGATIVE
Ketones, ur: NEGATIVE mg/dL
Nitrite: NEGATIVE
Protein, ur: NEGATIVE mg/dL
Specific Gravity, Urine: 1.012 (ref 1.005–1.030)
Urobilinogen, UA: 0.2 mg/dL (ref 0.0–1.0)
pH: 6.5 (ref 5.0–8.0)

## 2010-12-09 LAB — AMMONIA: Ammonia: 40 umol/L — ABNORMAL HIGH (ref 11–35)

## 2010-12-09 LAB — DIFFERENTIAL
Basophils Absolute: 0 10*3/uL (ref 0.0–0.1)
Basophils Absolute: 0 K/uL (ref 0.0–0.1)
Basophils Relative: 0 % (ref 0–1)
Basophils Relative: 0 % (ref 0–1)
Eosinophils Absolute: 0.2 10*3/uL (ref 0.0–0.7)
Eosinophils Absolute: 0.2 10*3/uL (ref 0.0–0.7)
Eosinophils Relative: 0 % (ref 0–5)
Eosinophils Relative: 4 % (ref 0–5)
Lymphocytes Relative: 25 % (ref 12–46)
Lymphocytes Relative: 8 % — ABNORMAL LOW (ref 12–46)
Lymphs Abs: 1.2 10*3/uL (ref 0.7–4.0)
Lymphs Abs: 1.2 K/uL (ref 0.7–4.0)
Monocytes Absolute: 0.5 10*3/uL (ref 0.1–1.0)
Monocytes Absolute: 0.7 10*3/uL (ref 0.1–1.0)
Monocytes Relative: 11 % (ref 3–12)
Monocytes Relative: 14 % — ABNORMAL HIGH (ref 3–12)
Neutro Abs: 2.7 10*3/uL (ref 1.7–7.7)
Neutro Abs: 7 10*3/uL (ref 1.7–7.7)
Neutrophils Relative %: 57 % (ref 43–77)
Neutrophils Relative %: 88 % — ABNORMAL HIGH (ref 43–77)

## 2010-12-09 LAB — CBC
HCT: 34.4 % — ABNORMAL LOW (ref 39.0–52.0)
HCT: 35.7 % — ABNORMAL LOW (ref 39.0–52.0)
HCT: 35.7 % — ABNORMAL LOW (ref 39.0–52.0)
Hemoglobin: 11.5 g/dL — ABNORMAL LOW (ref 13.0–17.0)
Hemoglobin: 11.9 g/dL — ABNORMAL LOW (ref 13.0–17.0)
MCH: 27.6 pg (ref 26.0–34.0)
MCHC: 33.5 g/dL (ref 30.0–36.0)
MCV: 82.4 fL (ref 78.0–100.0)
MCV: 83 fL (ref 78.0–100.0)
MCV: 83.5 fL (ref 78.0–100.0)
Platelets: 246 K/uL (ref 150–400)
Platelets: 250 10*3/uL (ref 150–400)
Platelets: 318 10*3/uL (ref 150–400)
Platelets: 345 10*3/uL (ref 150–400)
RBC: 4.16 MIL/uL — ABNORMAL LOW (ref 4.22–5.81)
RBC: 4.28 MIL/uL (ref 4.22–5.81)
RBC: 4.33 MIL/uL (ref 4.22–5.81)
RDW: 12.6 % (ref 11.5–15.5)
RDW: 12.7 % (ref 11.5–15.5)
RDW: 12.7 % (ref 11.5–15.5)
RDW: 12.8 % (ref 11.5–15.5)
WBC: 11.3 10*3/uL — ABNORMAL HIGH (ref 4.0–10.5)
WBC: 11.6 10*3/uL — ABNORMAL HIGH (ref 4.0–10.5)
WBC: 4.7 K/uL (ref 4.0–10.5)
WBC: 5.2 10*3/uL (ref 4.0–10.5)
WBC: 8 10*3/uL (ref 4.0–10.5)

## 2010-12-09 LAB — RAPID URINE DRUG SCREEN, HOSP PERFORMED
Amphetamines: NOT DETECTED
Barbiturates: NOT DETECTED
Benzodiazepines: POSITIVE — AB
Cocaine: NOT DETECTED
Opiates: NOT DETECTED
Tetrahydrocannabinol: NOT DETECTED

## 2010-12-09 LAB — COMPREHENSIVE METABOLIC PANEL
ALT: 32 U/L (ref 0–53)
AST: 19 U/L (ref 0–37)
Albumin: 2.7 g/dL — ABNORMAL LOW (ref 3.5–5.2)
Albumin: 3.1 g/dL — ABNORMAL LOW (ref 3.5–5.2)
Alkaline Phosphatase: 53 U/L (ref 39–117)
Alkaline Phosphatase: 60 U/L (ref 39–117)
Alkaline Phosphatase: 63 U/L (ref 39–117)
BUN: 15 mg/dL (ref 6–23)
Calcium: 8.2 mg/dL — ABNORMAL LOW (ref 8.4–10.5)
Chloride: 105 mEq/L (ref 96–112)
Chloride: 106 mEq/L (ref 96–112)
GFR calc Af Amer: >60 mL/min (ref >60)
GFR calc Af Amer: >60 mL/min (ref >60)
Glucose, Bld: 182 mg/dL — ABNORMAL HIGH (ref 70–99)
Potassium: 3.3 mEq/L — ABNORMAL LOW (ref 3.5–5.1)
Potassium: 3.4 mEq/L — ABNORMAL LOW (ref 3.5–5.1)
Potassium: 3.8 mEq/L (ref 3.5–5.1)
Sodium: 138 mEq/L (ref 135–145)
Total Bilirubin: 0.2 mg/dL — ABNORMAL LOW (ref 0.3–1.2)
Total Bilirubin: 0.3 mg/dL (ref 0.3–1.2)
Total Protein: 5.4 g/dL — ABNORMAL LOW (ref 6.0–8.3)
Total Protein: 5.9 g/dL — ABNORMAL LOW (ref 6.0–8.3)

## 2010-12-09 LAB — BASIC METABOLIC PANEL
BUN: 19 mg/dL (ref 6–23)
CO2: 28 mEq/L (ref 19–32)
GFR calc Af Amer: >60 mL/min (ref >60)
GFR calc non Af Amer: >60 mL/min (ref >60)
Glucose, Bld: 112 mg/dL — ABNORMAL HIGH (ref 70–99)
Potassium: 3.5 mEq/L (ref 3.5–5.1)
Potassium: 3.7 mEq/L (ref 3.5–5.1)
Sodium: 135 mEq/L (ref 135–145)

## 2010-12-09 LAB — HEPATIC FUNCTION PANEL
ALT: 37 U/L (ref 0–53)
AST: 44 U/L — ABNORMAL HIGH (ref 0–37)
Albumin: 3.1 g/dL — ABNORMAL LOW (ref 3.5–5.2)
Alkaline Phosphatase: 69 U/L (ref 39–117)
Bilirubin, Direct: 0.2 mg/dL (ref 0.0–0.3)
Indirect Bilirubin: 0.2 mg/dL — ABNORMAL LOW (ref 0.3–0.9)
Total Bilirubin: 0.4 mg/dL (ref 0.3–1.2)
Total Protein: 6.1 g/dL (ref 6.0–8.3)

## 2010-12-09 LAB — MAGNESIUM: Magnesium: 2.1 mg/dL (ref 1.5–2.5)

## 2010-12-09 LAB — LIPID PANEL
HDL: 47 mg/dL (ref >39)
LDL Cholesterol: 84 mg/dL (ref 0–99)

## 2010-12-09 LAB — MRSA PCR SCREENING: MRSA by PCR: NEGATIVE

## 2010-12-09 LAB — BASIC METABOLIC PANEL WITH GFR
BUN: 9 mg/dL (ref 6–23)
Calcium: 8.4 mg/dL (ref 8.4–10.5)
Chloride: 102 meq/L (ref 96–112)
Creatinine, Ser: 0.92 mg/dL (ref 0.4–1.5)
GFR calc Af Amer: >60 mL/min (ref >60)
GFR calc non Af Amer: >60 mL/min (ref >60)

## 2010-12-10 LAB — CBC
MCHC: 33.1 g/dL (ref 30.0–36.0)
RDW: 13.7 % (ref 11.5–15.5)

## 2010-12-10 LAB — BASIC METABOLIC PANEL
BUN: 9 mg/dL (ref 6–23)
Calcium: 7.9 mg/dL — ABNORMAL LOW (ref 8.4–10.5)
Creatinine, Ser: 0.81 mg/dL (ref 0.4–1.5)
GFR calc non Af Amer: >60 mL/min (ref >60)
Glucose, Bld: 109 mg/dL — ABNORMAL HIGH (ref 70–99)

## 2010-12-10 LAB — DIFFERENTIAL
Basophils Absolute: 0 10*3/uL (ref 0.0–0.1)
Basophils Relative: 1 % (ref 0–1)
Monocytes Absolute: 0.8 10*3/uL (ref 0.1–1.0)
Neutro Abs: 3.7 10*3/uL (ref 1.7–7.7)
Neutrophils Relative %: 62 % (ref 43–77)

## 2010-12-15 LAB — DIFFERENTIAL
Basophils Relative: 0 % (ref 0–1)
Eosinophils Absolute: 0.1 10*3/uL (ref 0.0–0.7)
Eosinophils Relative: 1 % (ref 0–5)
Monocytes Absolute: 0.8 10*3/uL (ref 0.1–1.0)
Monocytes Relative: 7 % (ref 3–12)
Neutrophils Relative %: 73 % (ref 43–77)

## 2010-12-15 LAB — COMPREHENSIVE METABOLIC PANEL
ALT: 56 U/L — ABNORMAL HIGH (ref 0–53)
AST: 30 U/L (ref 0–37)
Albumin: 3.5 g/dL (ref 3.5–5.2)
Alkaline Phosphatase: 67 U/L (ref 39–117)
Glucose, Bld: 149 mg/dL — ABNORMAL HIGH (ref 70–99)
Potassium: 4.1 mEq/L (ref 3.5–5.1)
Sodium: 139 mEq/L (ref 135–145)
Total Protein: 6.3 g/dL (ref 6.0–8.3)

## 2010-12-15 LAB — CBC
Hemoglobin: 12.8 g/dL — ABNORMAL LOW (ref 13.0–17.0)
RBC: 4.07 MIL/uL — ABNORMAL LOW (ref 4.22–5.81)
RDW: 14.4 % (ref 11.5–15.5)

## 2011-01-07 LAB — WOUND CULTURE

## 2011-01-23 ENCOUNTER — Other Ambulatory Visit: Payer: Self-pay | Admitting: Gastroenterology
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
## 2011-02-09 NOTE — Assessment & Plan Note (Signed)
A 45 year old male with L5-S1 laminectomy who has reported 1998  postoperative pain chronically since that time.  He has cervical  spondylosis with chronic neck pain.  He is diagnosed with chronic pain  syndrome suspected substance abuse disorder based on prior medical  record.  He states his brother had a substance abuse problem and those  records as well as somebody else.  He has been consistent in terms of  his reported medication of methadone on his urine drug screen, no  illicit drugs on testing x2.  I did talk to his primary care physician  assistant [facility]. [facility] from [facility], and she has  indicated he has been compliant with his care.  He has had MRI  demonstrating bilateral stenosis, foraminal stenosis at L5-S1, right  paracentral disc protrusion  at L3-L4.   The patient does not want any surgical intervention.  The pain score  8/10, but he states with the medications his relief is good.  He  indicates the need for assistance with dressing, bathing, meal prep, and  hospital duty.   He is divorced, lives with his 2 sons, father and mother smoke half pack  to a pack a day.  He is trying to quit.  He denies any illicit drug use.   PHYSICAL EXAMINATION:  Vital signs:  Blood pressure 146/97, pulse 96,  respiratory rate 18, O2 sat 96% on room air.  GENERAL:  In no acute distress.  BACK:  Mild tenderness to palpation to the lumbosacral paraspinals.  His  lower extremity strength is normal.  Deep tendon reflexes are normal.  NECK:  Good range of motion.  EXTREMITIES:  His upper extremity strength is normal.   IMPRESSION:  1. 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  2. Lumbar spondylosis without myelopathy.  3. History of substance abuse which the patient denies, prior hospital      records indicating this.  Once again he states that it was not he.      His urine drug screens have been consistent with medications      reported.   I do  think he has some indication for narcotic analgesics, but obviously  needed to be monitored closely.  I did indicate that I am not  comfortable prescribing the methadone since it has such a narrow  therapeutic range and many drug interactions.  Basically, in this  situation, only be comfortable with the fentanyl patch, but we would  first need to reduce his methadone dose.  We will repeat UDS today, have  him start to wean his methadone from 3 tablets 3 times a day to 2  tablets 3 times a day, 2 tablets twice a day for a day, 1 tablet 3 times  a day for a day,  upto 3 days prior to his next appointment.  Then we  can see him and start him on a fentanyl patch 25 mcg q.72 hours.  Should  he fail his next UDS, we would not prescribe it at that point and we  would discharge.      Charles [name], M.D.  Electronically Signed     AEK/MedQ  D:  05/17/2008 17:18:02  T:  05/18/2008 05:51:22  Job #:  [id number]
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
## 2011-02-09 NOTE — Assessment & Plan Note (Signed)
[facility]                         GASTROENTEROLOGY OFFICE NOTE   SABIEN, UMLAND                   MRN:          [id number]  DATE:12/26/2007                            DOB:          Aug 18, 1966    PROBLEM:  Gastroesophageal reflux disease.   Mr. Buch has returned following upper endoscopy and dilatation.  Since his dilatation to 18 mm, he has had no further dysphagia.  On  Nexium, he is symptom-free.  He has ran out of medicine several days ago  and has recurrent pyrosis.  He also has hepatitis C for which he is  about to start therapy with Interferon.   EXAM:  Pulse 66, blood pressure 120/70, weight 202.   IMPRESSION:  1. Esophageal stricture - asymptomatic following dilatation therapy.  2. Gastroesophageal reflux disease.  3. Hepatitis C.   RECOMMENDATIONS:  1. Continue Nexium.  2. Repeat dilatation as needed.  3. Follow up in the hepatitis clinic.     Barbette Hair. Arlyce Dice, MD,FACG  Electronically Signed    RDK/MedQ  DD: 12/26/2007  DT: 12/26/2007  Job #: [id number]   cc:   Ernestina Penna, M.D.
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
## 2011-02-09 NOTE — Assessment & Plan Note (Signed)
Date of last visit was August 20, 2008.   HISTORY OF PRESENT ILLNESS:  A 45 year old male with lumbar post  laminectomy syndrome, chronic postoperative pain as well as cervical  spondylosis without myelopathy.  The pain has been under better control  since being on fentanyl patch.  He does have some breakthrough pain with  activity, which is only partially relieved with his oxycodone 5 mg.  In  addition, his patch seems to be wearing out at that third day, so he  does experience increased pain on those days.   His pain is 5/10 on average, although 7/10 currently.  He states his  patch is wearing out today.   His pain is worse during the morning and evening hour, but not bad at  night.  His sleep is fair.  His pain is increasing with walking,  bending, sitting, standing, involves the back, buttock on the left and  proximal left lower extremity, as well as left upper extremity  particularly at the fourth and fifth digit.   REVIEW OF SYSTEMS:  Positive for numbness in the fifth digit, trouble  walking, spasms, and anxiety.   SOCIAL HISTORY:  Divorced.  Lives with his parents and his sons.  He  smokes one-half to one-pack per day.   PHYSICAL EXAMINATION:  VITAL SIGNS:  Blood pressure 150/92, he states  that it is because of pain.  His pulse 70, respirations 18, and O2  saturation 97% room air.  GENERAL:  No acute stress.  Orientation x3.  Affect alert.  Gait is with  a limp.  Uses a cane at times, although he can walk in my office without  the cane.  BACK:  Mild tenderness to palpation, left greater than right lower  lumbar area.  EXTREMITIES:  His lower extremity strength is full.  He is able to  ambulate without evidence of toe drag or knee instability, although he  has a stenotic appearing gait.  Lower extremity strength is full.  Deep  tendon reflexes are normal.   IMPRESSION:  1. Lumbar post laminectomy syndrome status post L5-S1 laminectomy in      1998.  2. Chronic  postoperative pain.  3. Cervical spondylosis without myelopathy.  He does have some left      upper extremity numbness, but this is more so on the left.  It used      to be on both sides.  Consider EMG to look at ulnar neuropathy.  4. We will change his fentanyl patches every 48 hours, but continue on      the 50 mcg dose.  We will discontinue the oxycodone 5 mg, change to      7.5 mg daily p.r.n. breakthrough and he already has refills on his      Lidoderm patch.  He will have the nursing visit in next 2 months.      I will see back in 3 months.      Erick Colace, M.D.  Electronically Signed     AEK/MedQ  D:  10/15/2008 13:12:34  T:  10/16/2008 01:11:40  Job #:  [id number]   cc:   [facility]

## 2011-02-09 NOTE — Assessment & Plan Note (Signed)
Mr. Tuccillo returns today.  He is a 45 year old male, L5-S1  laminectomy with postop dural tear, status post repair, this was back in  1998.  He has had chronic pain since that time.  I have weaned him off  the methadone and he has not taken any today.  He feels a bit rough  today.  He has had some increase in left upper extremity pain and  numbness in the left fourth and fifth digits.  He has pain in the elbow  as well as the shoulder.  He feels like he has some numbness and pain  going down the entire arm on the medial aspect.  He has had no neck  injuries lately.  He does have a known cervical spondylosis.  He  describes the pain as sharp, burning, dull, stabbing, constant,  tingling, and aching, interferes with activity at 7/10 level, and worse  in the morning and evening hours.  Sleep is fair.  Pain improves with  rest, heat, and medications.  He can walk 15-30 minutes at times, climbs  steps, he does not drive.  Pain increases with household duties and  shopping.  He has some anxiety, but denies any depression or suicidal  thoughts.  He has numbness and tingling as noted above, nausea and some  abdominal pain at times.  He has had abdominal surgery in the past,  postop chronic abdominal pain related to that.   SOCIAL HISTORY:  He lives with his 2 sons who are teenaged and his  parents in [place] area.  He smokes 1/2-1 pack per day.   PHYSICAL EXAMINATION:  VITAL SIGNS:  Blood pressure 133/69, pulse 81,  respiratory rate is 18, and O2 sat is 96% on room air.  MUSCULOSKELETAL:  His Oswestry Disability index score is 54.   His neck has decreased range of motion, forward flexion, extension,  lateral rotation, and bending.  About 50% range in the planes.  He is  positive to Spurling test on the left side causing some pain going into  the upper extremity and the right side is negative.  He has negative  Tinel's over the elbow.  He has 5/5 strength in bilateral deltoid,  biceps,  triceps, grip, as well as hand intrinsics as well as lower  extremities.  Deep tendon reflexes are normal.  Sensation reduced in the  left fourth and fifth digits compared to the right side and compared to  the third digit.   He has normal muscle bulk in the upper extremities.  Gait shows no  evidence of toe drag or knee instability.  No clonus.   IMPRESSION:  1. Lumbar postlaminectomy pain syndrome.  2. Cervical spondylosis, question radiculopathy.   PLAN:  He is off his methadone, now we can switch him to a low dose  patch, fentanyl 25 mcg q.72 h.  We will put him on Medrol Dosepak and if  not any better when I see him next, we will go ahead and re-image his  neck.      Erick Colace, M.D.  Electronically Signed     AEK/MedQ  D:  05/31/2008 14:00:45  T:  06/01/2008 05:12:27  Job #:  [id number]   cc:   Ernestina Penna, M.D.  Fax: [phone]

## 2011-02-09 NOTE — Op Note (Signed)
NAMEPEDRAM, Charles [name]            ACCOUNT NO.:  [id number]   MEDICAL RECORD NO.:  [id number]          PATIENT TYPE:  AMB   LOCATION:  DSC                          FACILITY:  [facility]   PHYSICIAN:  Katy Fitch. Sypher, M.D. DATE OF BIRTH:  Aug 07, 1966   DATE OF PROCEDURE:  05/16/2007  DATE OF DISCHARGE:                               OPERATIVE REPORT   PREOPERATIVE DIAGNOSIS:  Mass of the left ring finger flexor sheath  adjacent to the A2 pulley.   POSTOPERATIVE DIAGNOSIS:  Mass of the left ring finger flexor sheath  adjacent to the A2 pulley.   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   SURGEON:  Katy Fitch. Sypher, M.D.   ASSISTANT:  No assistant.   ANESTHESIA:  The anesthesia is general by LMA.   ANESTHESIOLOGIST:  The supervising anesthesiologist is E. Jairo Ben, M.D.   INDICATIONS:  The patient is a 45 year old gentleman referred through  the courtesy of Dr. Dara Hoyer for evaluation and management of an  uncomfortable cyst/mass overlying the left ring finger flexor sheath at  the long ring web space.  Many years ago the patient had seen Dr. Fannie Knee  for excision of a cyst from the region of the A1 pulley on the same  finger.  He developed a second cyst and had persistent discomfort when  he would grasp a firm object.  He brought this to the attention of Dr.  Arlyce Dice who saw him at [facility]; primary care, and he was referred  for an upper extremity orthopedic consult.   Clinical examination in the office revealed evidence of a myxoid cyst of  the flexor sheath.  We advised proceeding with resection of the cyst at  that time under either general anesthesia or local anesthesia and  sedation.   DESCRIPTION OF THE OPERATION:  The patient was brought to the operating  room and placed in the supine position on the operating table.  Prior to surgery he was interviewed by Dr. Jean Rosenthal and our initial plan  was to consider  local anesthesia and sedation.  Due to the extreme  tolerance to medication, however, it became apparent that the use of  sedation could be problematic.  Therefore under Dr. Edison Pace direct  supervision general anesthesia by LMA technique was induced.   The left arm was prepped with Betadine soap and solution, and sterilely  draped.  A pneumatic tourniquet was applied to the proximal left  brachium.  Following exsanguination of left arm with an Esmarch bandage  the arterial tourniquet was inflated to 220 mmHg.  Then 2% lidocaine was  infiltrated into the path of the intended incision for postoperative  analgesia.   The procedure commenced with an oblique incision directly over the  palpable mass.  The subcutaneous tissue was carefully divided taking  care to identify the radial and proper digital nerve and artery.  These  were gently retracted.  A Glorious Peach was used to clear all the soft tissue  off the cyst followed by excision of the cyst with scissor dissection  and use  of a rongeur to clean the A2 pulley.  There were no apparent  complications.   The wound was then repaired with horizontal mattress sutures of 5-0  nylon.   COMPLICATIONS:  There were no apparent complications.   The patient tolerated the surgery and anesthesia well.  He was  transferred to the recovery room with stable vital signs.   Postoperatively he was provided with a prescription for Percocet 5 mg 1  by mouth every 4-6 hours as needed for pain.  He is on chronic methadone  maintenance and will use the Percocet to supplement his methadone.      Katy Fitch Sypher, M.D.  Electronically Signed     RVS/MEDQ  D:  05/16/2007  T:  05/17/2007  Job:  [id number]

## 2011-02-09 NOTE — Assessment & Plan Note (Signed)
Mr. Charles [name] returns today.  I last saw him on June 01, 2008.  He  has been switched from methadone to fentanyl at 25 mcg.  He has had an  increase in his pain since the last visit.  His Oswestry score is 60%  compared to 54% last visit, which is within range.   He needs some help with meal prep, household duties, and occasionally  bathing and dressing.   REVIEW OF SYSTEMS:  Positive for numbness and tingling, mainly in the  right hip area, trouble walking, spasms, anxiety, nausea, poor appetite;  however, he has gained about 5 pounds in a month.   SOCIAL HISTORY:  Divorced.  Lives with his 2 sons, his mother and  father.  Smokes half a pack to a pack per day, trying to quit.   PHYSICAL EXAMINATION:  VITAL SIGNS:  Blood pressure 146/86, pulse 80,  respirations 18, and O2 sat 96% on room air.  GENERAL:  Well-developed, well-nourished male in no acute distress.  Orientation x3.  Affect is alert.  EXTREMITIES:  Ambulation with cane.  Coordination normal.  Mild tremor  in the bilateral upper extremities.  Deep tendon reflexes are normal.  Sensation normal in upper and lower extremities.  Extremities showed no  evidence of edema.  His back range of motion is 50% forward flexion and  extension.   Motor strength is 4 in the bilateral upper and lower extremities.  Range  of motion is normal in the bilateral lower extremities.   IMPRESSION:  1. Lumbar post-laminectomy syndrome.  2. Chronic postoperative pain.  3. Cervical spondylosis without myelopathy.   PLAN:  1. Given his increased pain level, we will increase his fentanyl to 50      mcg.  His equal analgesic dose was between 25 and 50, he opted for      the lower, but he may well need the 50.  2. He will continue Lidoderm patch.  He received some samples, this      has been helpful.  I will see him back in 1 month.      Erick Colace, M.D.  Electronically Signed     AEK/MedQ  D:  06/28/2008 13:31:12  T:   06/29/2008 01:30:18  Job #:  [id number]   cc:   Ernestina Penna, M.D.  Fax: [phone]

## 2011-02-12 NOTE — Discharge Summary (Signed)
Charles [name], Charles [name]            ACCOUNT NO.:  [id number]   MEDICAL RECORD NO.:  [id number]          PATIENT TYPE:  INP   LOCATION:  4707                         FACILITY:  [facility]   PHYSICIAN:  Jonna L. Robb Matar, M.D.DATE OF BIRTH:  [birth year]   DATE OF ADMISSION:  04/17/2006  DATE OF DISCHARGE:  04/17/2006                                 DISCHARGE SUMMARY   FINAL DIAGNOSES:  1.  Overdose of benzodiazepines, cocaine and alcohol.  2.  Substance abuse, PCP.   HISTORY:  The little bit of history that is available suggests that the  patient was sedated from Xanax, cocaine and alcohol.  The patient was very  sleepy. Pupils were reactive.  His father gave his list of medications as  Methadone 150 mg p.o. t.i.d., Xanax 1 mg p.o. t.i.d., Soma 350 p.o. q.i.d.,  Neurontin 600 p.o. q.i.d., Phenergan 25 mg p.o. q.i.d., Nexium 40 p.o. every  day.   DISPOSITION:  The patient woke up later in the morning, was completely alert  and oriented, also very verbally abusive.  The patient will be discharged on  the above listed medications.  I suggested that he avoid alcohol and other  sedating substances.  I had to call his father to find out who was  prescribing the medications.  The patient, himself, did not want to give  that information.      Jonna L. Robb Matar, M.D.  Electronically Signed     JLB/MEDQ  D:  04/17/2006  T:  04/17/2006  Job:  [id number]   cc:   Hinkling, MD
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
## 2011-02-12 NOTE — Letter (Signed)
December 08, 2006    Hettie Holstein   RE:  AMAIR, SHROUT  MRN:  [id number]  /  DOB:  01/07/1966   Dear Mr. Hulda Humphrey:   It is my pleasure to have treated you recently as a new patient in my  office.  I appreciate your confidence and the opportunity to participate  in your care.   Since I do have a busy inpatient endoscopy schedule and office schedule,  my office hours vary weekly.  I am, however, available for emergency  calls every day through my office.  If I cannot promptly meet an urgent  office appointment, another one of our gastroenterologists will be able  to assist you.   My well-trained staff are prepared to help you at all times.  For  emergencies after office hours, a physician from our gastroenterology  section is always available through my 24-hour answering service.   While you are under my care, I encourage discussion of your questions  and concerns, and I will be happy to return your calls as soon as I am  available.   Once again, I welcome you as a new patient and I look forward to a happy  and healthy relationship.    Sincerely,      Barbette Hair. Arlyce Dice, MD,FACG  Electronically Signed   RDK/MedQ  DD: 12/08/2006  DT: 12/10/2006  Job #: [id number]

## 2011-02-12 NOTE — H&P (Signed)
[facility]  Patient:    Charles [name], Charles [name] Visit Number: [id number] MRN: [id number]          Service Type: MED Location: 1E 0103 01 Attending Physician:  Dominica Severin Dictated by:   Ottie Glazier. Wynona Neat, P.A.-C. Admit Date:  02/28/2002 Discharge Date: 03/04/2002                           History and Physical  CHIEF COMPLAINT:  Right elbow pain.  HISTORY OF PRESENT ILLNESS:  The patient is a 45 year old white male who presents to the acute care clinic today complaining of right elbow pain.  The patient states that he fell on Feb 22, 2002, and noted increase in pain and swelling.  Since that time he has noted increased swelling and redness of the right elbow with symptoms being worse at night.  Other than the incident where he fell, he recalls no other traumatic events.  ALLERGIES:  No known drug allergies.  CURRENT MEDICATIONS: 1. OxyContin 20 mg p.o. t.i.d. 2. Xanax 1 mg t.i.d. 3. Oxycodone. 4. Soma per Dr. Noreene Filbert.  PAST MEDICAL HISTORY:  Significant for migraines.  PAST SURGICAL HISTORY: 1. Right hand surgery in 1992. 2. Leg surgery in 1991. 3. Abdominal surgery in 1989.  SOCIAL HISTORY:  The patient is disabled.  He smokes one pack per day.  He denies any alcohol use.  PHYSICAL EXAMINATION:  GENERAL:  This is a 45 year old male appearing his stated age.  Somewhat uncomfortable due to right elbow pain.  NECK:  Supple.  Without masses or carotid bruits.  CHEST:  Clear to auscultation bilaterally.  BREASTS:  Deferred, not pertinent to present illness.  HEART:  Regular rate and rhythm.  ABDOMEN:  Soft and nontender.  Bowel sounds are positive.  GENITOURINARY:  Deferred, not pertinent to present illness.  EXTREMITIES:  Right upper extremity shows that there is erythema noted on the medial portion of the elbow with a palpable epitrochlear lymph node approximately 3 x 3 cm.  There is noted to be erythematous changes of  this area.  Neurovascular is intact.  There is no effusion.  Motor function intact.  LABORATORY DATA:  X-rays were taken and revealed no acute bony abnormalities.  IMPRESSION:  Cellulitis of the right upper extremity with epitrochlear lymph node involvement.  PLAN:  The patient will be admitted to [facility] where he will undergo a trial of IV antibiotics and continued observation for improvement of his treatment. Dictated by:   Ottie Glazier. Wynona Neat, P.A.-C. Attending Physician:  Dominica Severin DD:  03/02/02 TD:  03/04/02 Job: [id number]

## 2011-02-12 NOTE — H&P (Signed)
NAMEBERWYN, Charles [name]            ACCOUNT NO.:  [id number]   MEDICAL RECORD NO.:  [id number]          PATIENT TYPE:  INP   LOCATION:  4707                         FACILITY:  [facility]   PHYSICIAN:  Theresia Bough, MD       DATE OF BIRTH:  1966/07/02   DATE OF ADMISSION:  04/17/2006  DATE OF DISCHARGE:  04/17/2006                                HISTORY & PHYSICAL   History is obtained from the ED physician and from the chart.   PRESENTING COMPLAINT:  Patient was brought in by EMS because of mental  status changes.   HISTORY OF PRESENT ILLNESS:  This is a 45 year old white male patient with a  history of drug abuse.  His friends called from a store this evening because  patient was sluggish response to verbal stimulus.  Patient was drinking  alcohol with his friends.  Patient drank beer and also took some Xanax  together.  Patient also used some cocaine some time today.  So, he began to  become sluggish and was not able to complete his sentence and the emergency  service was called.  When the EMS got there, patient received 1/2 ampule of  D50.  Patient also received 2 mg of Narcan IV from the ED physician.  Patient was brought into the Emergency Room.  At the Emergency Room, he was  seen by the ED physician.  Patient was laid on the couch.  Patient started  to fall asleep.  He was able to maintain his airway and subsequently fell  asleep.   REVIEW OF SYSTEMS:  Cannot be obtained at this time.   FAMILY HISTORY:  Not contributory.   SOCIAL HISTORY:  Patient has a past history of drug abuse.  He smokes  cigarettes, uses marijuana, uses cocaine, alcohol.   HOME MEDICATIONS:  I am not sure of his home medications at this time.   PHYSICAL EXAMINATION:  Temperature of 98, blood pressure of 118/60, heart  rate of 73, respirations of 50.  Patient was saturating at 98%, but he is  currently on 5 mg of oxygen.  HEAD & NECK:  Shows pink conjunctiva, patient has no jaundice, his neck is  supple.  His mucous membrane is moist.  CHEST:  __________ and auscultation shows clear breath sounds.  CARDIOVASCULAR:  Shows normal heart sounds, no murmur, no gallop.  No  tachycardia.  The pulses are palpable in the radial as well as in the  dorsalis pedis.  CENTRAL NERVOUS SYSTEM:  Patient is asleep at this time, does not respond to  command.  His pupils are 2 mm and they are reactive to light.  His  extraocular muscles are intact.  ABDOMEN:  Soft, no tenderness, no masses palpable.  Patient has normal bowel  sounds.   INITIAL TESTS:  UA is negative.  CBC showed WBC 9.1, hemoglobin 13.4,  hematocrit of 40, platelets of 309.  Drug screen shows positive cocaine,  positive benzodiazepines.  Salicylate level was less than 4, which is  normal.  Acetaminophen level was less than 10, which is normal.  Alcohol  level was 5 mg/mL,  which is normal.  Chemistry shows sodium of 140,  potassium 3.8, chloride of 104, a bicarbonate of 20, glucose of 23, BUN of  11, creatinine of 4.9, calcium of 9.2.  The total bilirubin was 0.9.  Chest  x-ray shows low volume and bibasilar atelectasis.  EKG shows normal sinus  rhythm and LVH by voltage.   ASSESSMENT:  Drug overdose.  Patient is currently maintaining his airway.  His saturation is more than 90%, though he is on 5 liters of oxygen.  Patient is calm at this time, is not agitated.  He is going to be admitted  to telemetry observation.   PLAN:  To keep his oxygen saturation greater than 90%.  Close monitoring to  make sure that patient continues to maintain his airway.  There is no need  for any intubation at this time.      Theresia Bough, MD  Electronically Signed     GA/MEDQ  D:  04/17/2006  T:  04/17/2006  Job:  [id number]
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
## 2011-02-12 NOTE — Discharge Summary (Signed)
Charles [name], Charles [name]            ACCOUNT NO.:  [id number]   MEDICAL RECORD NO.:  [id number]          PATIENT TYPE:  INP   LOCATION:  0449                         FACILITY:  [facility]   PHYSICIAN:  Danae Chen, M.D.DATE OF BIRTH:  09/10/1966   DATE OF ADMISSION:  09/19/2004  DATE OF DISCHARGE:  09/23/2004                                 DISCHARGE SUMMARY   The patient no primary doctor, does see Dr. Sharene Skeans at [facility].   DISCHARGE DIAGNOSES:  1.  Cellulitis left upper extremity probable secondary to IV drug use.  2.  Reflux disease.  3.  Chronic pain syndrome secondary to degenerative joint disease.  4.  History of hepatitis C.   DISCHARGE MEDICATIONS:  1.  Keflex 500 mg p.o. t.i.d. x7 days.  2.  Percocet for pain 10/325 every 6 hours as needed #15 tablets, no      refills.   The patient is to resume home medications as prior doses:  1.  Xanax 1 mg p.o. t.i.d.  2.  Soma 350 mg p.o. q.i.d.  3.  Neurontin 600 mg p.o. q.i.d.  4.  Nexium 40 mg p.o. daily.  5.  Phenergan 25 mg p.o. p.r.n. q.i.d.   PROCEDURE:  None.   CULTURES:  Negative wound culture and blood cultures at time of discharge.   PERTINENT LABS:  As above.   CONDITION ON DISCHARGE:  Improve.   PHYSICAL EXAMINATION AT DISCHARGE:  VITAL SIGNS:  The patient is afebrile.  LUNGS:  Clear.  ABDOMEN:  Soft.  HEART:  Rate is regular.  EXTREMITIES:  Examination of the arm reveals decreased erythema and warmth.  No signs of active infection at this time.   BRIEF HOSPITAL COURSE:  The patient is a 45 year old gentleman with a  history of IV drug use who was admitted after complaining of left arm  redness and swelling.  It was thought that exam was consistent with  cellulitis probably secondary to his IV drug use.  He is admitted, started  on IV antibiotics and did well during his hospital stay.  Tolerated his IV  medications and was switched to p.o. at the time of discharge and needs to  complete a 10 day  course of antibiotics for such.  He is instructed to avoid illicit drugs  including IV drug use and he agrees.  Followup with his physician at  [facility] as needed for his chronic pain and patient  will be referred to [facility] for other primary care needs.     Charles [name]   RLK/MEDQ  D:  09/23/2004  T:  09/23/2004  Job:  [id number]

## 2011-02-12 NOTE — Letter (Signed)
December 08, 2006    Ernestina Penna, M.D.  [address] [place], [place] [address]   RE:  Charles [name], Charles [name]  MRN:  [id number]  /  DOB:  07/17/1966   Dear Dr. Christell Constant:   Upon your kind referral, I had the pleasure of evaluating your patient  and I am pleased to offer my findings.  I saw Hettie Holstein in the  office today.  Enclosed is a copy of my progress note that details my  findings and recommendations.   Thank you for the opportunity to participate in your patient's care.    Sincerely,      Barbette Hair. Arlyce Dice, MD,FACG  Electronically Signed    RDK/MedQ  DD: 12/08/2006  DT: 12/10/2006  Job #: [id number]

## 2011-02-12 NOTE — Discharge Summary (Signed)
[facility]  Patient:    Charles [name], Charles [name] Visit Number: [id number] MRN: [id number]          Service Type: MED Location: 1E 0103 01 Attending Physician:  Dominica Severin Dictated by:   Sammuel Cooper. Mahar, P.A.-C. Admit Date:  02/28/2002 Discharge Date: 03/04/2002                             Discharge Summary  DATE OF BIRTH:  November 26, 1965  ADMITTING DIAGNOSIS:  Right upper extremity cellulitis.  DISCHARGE DIAGNOSIS:  Right upper extremity cellulitis - improved.  PROCEDURES:  None.  CONSULTS:  None.  HISTORY OF PRESENT ILLNESS:  The patient is a 45 year old male who fell on Feb 22, 2002 and noted an increase in pain in the right elbow.  Shortly thereafter, he developed some erythema.  He has had increasing erythema and swelling to the area describing a nodule in that area.  He presented to [facility] for evaluation by Dr. Dominica Severin.  Following examination, I thought his best course of management would be hospital admission for IV antibiotics, elevation, and observation.  LABORATORIES:  A CBC was ordered on admission however, the lab results are not able to be obtained on the chart at this time.  X-RAYS:  None.  EKG:  None.  HOSPITAL COURSE:  Following admission on February 28, 2002, the patient was started on IV Unasyn.  He was placed in a Glisson sling for elevation and using a K-pad for moist heat.  This treatment was done throughout his hospital course. He responded well to the IV antibiotics slowly over the course of the next 4 days with gradual improvement.  Erythema did improve significantly and was resolved prior to discharge.  The swelling did improve significantly.  He did have a continued nodule in the epitrochlear area however, it was improving prior to discharge.  His pain significantly improved over the course of his 4 days of admission as well.  Occupational therapy was consulted to work with the patient  on range of motion and edema control.  This was done.  He indicated understanding and did well with them.  He had a low-grade temperature on March 02, 2002 of 99.3.  This did resolve prior to discharge.  He did have a couple of issues with his IV backing up blood and he was, according to the nursing staff, manipulating his IV on his own.  They caught him doing this on a few occasions.  Unsure of exactly what was going on they asked him to call them should any problems arise with the IV.  It did happen another time in which he also did not call however, there was no complication as a result of that IV.  There was another issue with the patient storing things in the ceiling tiles which was resolved with no event occurring.  Prior to discharge on March 04, 2002, the patient was doing very well.  He stated he was ready for discharge and wanting to go home.  He had responded well to the IV antibiotics.  I discussed at length with him the need for him to continue on the antibiotics that I was writing for him.  I also did write for him some pain medications and we discussed these at length as he is in a contract with a pain control group and the patient did offer up this information to me.  We did give  him an addition of new pain medications for this issue with his right arm and he understands that he will need to resume pain control management through his pain control group.  On the day of discharge his vital signs are stable, he was afebrile, he was neurovascularly intact and had been throughout the hospital stay, his motor function was 5/5 strengthwise at the bilateral upper extremities and the erythema improved, swelling had decreased, he did have a continued nodule on the medial aspect of his right elbow.  DISCHARGE PLAN: 1. Medications:    a. Augmentin 875 p.o. b.i.d.    b. Percocet 5/325 one to two tablets p.o. q.4h. p.r.n. pain.    c. Tylenol as needed over the counter for any  temperatures. 2. Followup: He was to follow up later that week Wednesday or Thursday with    Dr. Amanda Pea.  He was instructed to call for an appointment. 3. Diet: Regular. 4. Activity: Elevate the right upper extremity above the level of his heart.    He may use it as tolerated and as comfortable.  CONDITION ON DISCHARGE:  Stable and improved.  DISPOSITION:  The patient is being discharged to his home.  DISCHARGE INSTRUCTIONS:  He was instructed to call immediately with any problems.  Do not wait until appointment with Dr. Amanda Pea if any problems or issues do arise with his right arm.  All of these issues were discussed at length with the patient.  He indicated understanding and agreed to all these terms and will follow up Wednesday or Thursday. Dictated by:   Sammuel Cooper. Mahar, P.A.-C. Attending Physician:  Dominica Severin DD:  03/15/02 TD:  03/17/02 Job: [id number]

## 2011-02-16 ENCOUNTER — Telehealth: Payer: Self-pay | Admitting: Gastroenterology

## 2011-02-16 NOTE — Telephone Encounter (Signed)
LEFT SAMPLES

## 2011-03-01 ENCOUNTER — Emergency Department (HOSPITAL_COMMUNITY)
Admission: EM | Admit: 2011-03-01 | Discharge: 2011-03-01 | Disposition: A | Discharge status: Home / Self Care | Payer: Medicaid Other | Attending: Emergency Medicine | Admitting: Emergency Medicine

## 2011-03-01 DIAGNOSIS — Z8619 Personal history of other infectious and parasitic diseases: Secondary | ICD-10-CM | POA: Insufficient documentation

## 2011-03-01 DIAGNOSIS — I10 Essential (primary) hypertension: Secondary | ICD-10-CM | POA: Insufficient documentation

## 2011-03-01 DIAGNOSIS — R000 Tachycardia, unspecified: Secondary | ICD-10-CM | POA: Insufficient documentation

## 2011-03-01 DIAGNOSIS — F411 Generalized anxiety disorder: Secondary | ICD-10-CM | POA: Insufficient documentation

## 2011-03-01 DIAGNOSIS — IMO0002 Reserved for concepts with insufficient information to code with codable children: Secondary | ICD-10-CM | POA: Insufficient documentation

## 2011-04-10 ENCOUNTER — Other Ambulatory Visit: Payer: Self-pay | Admitting: Gastroenterology

## 2011-04-12 ENCOUNTER — Telehealth: Payer: Self-pay | Admitting: Gastroenterology

## 2011-04-13 NOTE — Telephone Encounter (Signed)
Left message for pt to call back  °

## 2011-04-27 MED ORDER — ESOMEPRAZOLE MAGNESIUM 40 MG PO CPDR: DELAYED_RELEASE_CAPSULE | ORAL | Status: DC

## 2011-04-27 NOTE — Telephone Encounter (Signed)
Spoke with pt and left samples of Nexium for him up front. Pt aware.

## 2011-06-24 LAB — POCT I-STAT, CHEM 8
Calcium, Ion: 0.86 — ABNORMAL LOW
Chloride: 106
HCT: 40
Hemoglobin: 13.6
Potassium: 5

## 2011-06-24 LAB — DIFFERENTIAL
Basophils Absolute: 0
Basophils Relative: 0
Eosinophils Absolute: 0.4
Neutro Abs: 2.5
Neutrophils Relative %: 39 — ABNORMAL LOW

## 2011-06-24 LAB — CBC
MCHC: 34
MCV: 93.9
RDW: 12.5

## 2011-07-01 ENCOUNTER — Other Ambulatory Visit: Payer: Self-pay | Admitting: Gastroenterology

## 2011-07-09 LAB — POCT HEMOGLOBIN-HEMACUE: Operator id: 123881

## 2011-08-02 ENCOUNTER — Other Ambulatory Visit: Payer: Medicaid Other

## 2011-08-02 ENCOUNTER — Other Ambulatory Visit: Payer: Self-pay | Admitting: Geriatric Medicine

## 2011-08-02 ENCOUNTER — Other Ambulatory Visit: Payer: Self-pay | Admitting: Gastroenterology

## 2011-08-03 ENCOUNTER — Other Ambulatory Visit: Payer: Self-pay | Admitting: Gastroenterology

## 2011-08-03 ENCOUNTER — Telehealth: Payer: Self-pay | Admitting: Gastroenterology

## 2011-08-03 ENCOUNTER — Ambulatory Visit
Admission: RE | Admit: 2011-08-03 | Discharge: 2011-08-03 | Disposition: A | Discharge status: Home / Self Care | Payer: Medicaid Other | Source: Ambulatory Visit | Attending: Geriatric Medicine | Admitting: Geriatric Medicine

## 2011-08-03 ENCOUNTER — Ambulatory Visit
Admission: RE | Admit: 2011-08-03 | Discharge: 2011-08-03 | Disposition: A | Discharge status: Home / Self Care | Payer: Medicaid Other | Source: Ambulatory Visit

## 2011-08-03 ENCOUNTER — Other Ambulatory Visit: Payer: Self-pay | Admitting: Geriatric Medicine

## 2011-08-03 NOTE — Telephone Encounter (Signed)
We are out of Nexium samples, tried to contact pt, no answer Sent pt's Nexium to pharmacy through E prescribe request

## 2011-08-03 NOTE — Telephone Encounter (Signed)
Called pt earlier to inform that  We are out of nexium samples. Sent in script for Nexium to his pharmacy Pt came by the office

## 2011-08-23 ENCOUNTER — Telehealth: Payer: Self-pay | Admitting: Gastroenterology

## 2011-08-23 NOTE — Telephone Encounter (Signed)
Pt coming to pick up samples today.

## 2011-08-25 ENCOUNTER — Telehealth: Payer: Self-pay | Admitting: Gastroenterology

## 2011-08-25 ENCOUNTER — Encounter: Payer: Self-pay | Admitting: *Deleted

## 2011-08-25 ENCOUNTER — Ambulatory Visit (INDEPENDENT_AMBULATORY_CARE_PROVIDER_SITE_OTHER): Payer: Medicaid Other | Admitting: Gastroenterology

## 2011-08-25 DIAGNOSIS — K2270 Barrett's esophagus without dysplasia: Secondary | ICD-10-CM

## 2011-08-25 DIAGNOSIS — K222 Esophageal obstruction: Secondary | ICD-10-CM

## 2011-08-25 DIAGNOSIS — K219 Gastro-esophageal reflux disease without esophagitis: Secondary | ICD-10-CM

## 2011-08-25 MED ORDER — ESOMEPRAZOLE MAGNESIUM 40 MG PO CPDR: 40.0000 mg | DELAYED_RELEASE_CAPSULE | Freq: Two times a day (BID) | ORAL | Status: DC

## 2011-08-25 NOTE — Progress Notes (Signed)
History of Present Illness:  Mr. Bromell has returned for followup of GERD. He has PPI-dependent GERD which is well controlled taking 1-2 Nexium each morning and occasionally before dinner. She she has skipped a day of Nexium he has severe pyrosis that may take a couple of days to get under control. He denies dysphagia. He has Barrett's esophagus diagnosed in 2011. He has a history of an esophageal stricture that was dilated twice. He denies dysphagia.    Review of Systems: He has had a persistent skin infection which is apparently due to a parasite. He has chronic low back pain Pertinent positive and negative review of systems were noted in the above HPI section. All other review of systems were otherwise negative.    Current Medications, Allergies, Past Medical History, Past Surgical History, Family History and Social History were reviewed in [employer] electronic medical record  Vital signs were reviewed in today's medical record. Physical Exam: General: Well developed , well nourished, no acute distress Head: Normocephalic and atraumatic Eyes:  sclerae anicteric, EOMI Ears: Normal auditory acuity Mouth: No deformity or lesions Lungs: Clear throughout to auscultation Heart: Regular rate and rhythm; no murmurs, rubs or bruits Abdomen: Soft, non tender and non distended. No masses, hepatosplenomegaly or hernias noted. Normal Bowel sounds Rectal:deferred Musculoskeletal: Symmetrical with no gross deformities  Pulses:  Normal pulses noted Extremities: No clubbing, cyanosis, edema or deformities noted Neurological: Alert oriented x 4, grossly nonfocal Psychological:  Alert and cooperative. Normal mood and affect His upper extremities are covered with bandages because of his chronic infection

## 2011-08-25 NOTE — Patient Instructions (Addendum)
Your prescription(s) have been sent to you pharmacy.  Your procedure has been scheduled for 08/30/2011, please follow the seperate instructions.

## 2011-08-25 NOTE — Telephone Encounter (Signed)
Pt states he is having problems with severe acid reflux, states his Nexium is not working. Pt requesting to be seen asap. Pt scheduled to see Dr. Arlyce Dice today at 3:45pm. Pt aware of appt date and time.

## 2011-08-25 NOTE — Assessment & Plan Note (Signed)
The patient has PPI-dependent GERD. His best response has been to Nexium. Plan to continue with the same.

## 2011-08-25 NOTE — Assessment & Plan Note (Signed)
Currently asymptomatic 

## 2011-08-25 NOTE — Assessment & Plan Note (Signed)
Plan followup endoscopy 

## 2011-08-30 ENCOUNTER — Encounter: Payer: Self-pay | Admitting: *Deleted

## 2011-08-30 ENCOUNTER — Telehealth: Payer: Self-pay | Admitting: *Deleted

## 2011-08-30 ENCOUNTER — Other Ambulatory Visit: Payer: Medicaid Other | Admitting: Gastroenterology

## 2011-08-30 ENCOUNTER — Telehealth: Payer: Self-pay | Admitting: Gastroenterology

## 2011-08-30 NOTE — Telephone Encounter (Signed)
Patient called and stated that he had a bad infection on both of his arms.   He said that he was on some type of medication, but he didn't know what it was at this time.   He said that the infection starts at his shoulders and goes all the way down to his wrists on both sides.   He is concerned that we won't be able to get an IV in due to the redness and swelling. Upon reading his chart, the patient has active MRSA.  Dr. Arlyce Dice notified and he said that it would be okay to cancel the patient for this afternoon.  I called the patient and notified the staff

## 2011-08-30 NOTE — Telephone Encounter (Signed)
The patient came to the Endo suite after we had discussed that he could stay home and get better.   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   He was wrapped in gauze from both of his shoulders to his wrists.   He stated that his memory wasn't very good, and that he really needed a Dr's validation for the insurance company so he could get his nexium and phenergan.   I will forward this note to Dr. Arlyce Dice.   The patient was happy to go home this afternoon, and apologized if he "gave [place] any trouble."   I told him that he was no trouble and that we would see him soon.

## 2011-08-30 NOTE — Progress Notes (Deleted)
Patient ID: Charles [name], male   DOB: 11-15-1965, 45 y.o.   MRN: [id number]

## 2011-08-31 ENCOUNTER — Other Ambulatory Visit: Payer: Self-pay | Admitting: Gastroenterology

## 2011-08-31 NOTE — Telephone Encounter (Signed)
Discussed with pt the need for him to try Omeprazole per [employer]. Pt will try 2 weeks samples of prilosec. Then we will do another prior auth for pt

## 2011-08-31 NOTE — Progress Notes (Signed)
This encounter was created in error - please disregard.

## 2011-08-31 NOTE — Telephone Encounter (Signed)
This encounter was created in error - please disregard.

## 2011-09-03 ENCOUNTER — Telehealth: Payer: Self-pay | Admitting: Gastroenterology

## 2011-09-03 NOTE — Telephone Encounter (Signed)
Called pt back and he requested that Robin call him back, states he talked to her earlier about his medicine.

## 2011-09-03 NOTE — Telephone Encounter (Signed)
Returned pts call. He received a letter from his insurance but does not understand it. Explained to pt to bring the letter to me the first of the week for me to read it for him

## 2011-09-07 ENCOUNTER — Telehealth: Payer: Self-pay | Admitting: *Deleted

## 2011-09-07 NOTE — Telephone Encounter (Signed)
Medication approved for 1 year, till 08/2012 patient notified

## 2011-09-29 ENCOUNTER — Emergency Department (HOSPITAL_COMMUNITY)
Admission: EM | Admit: 2011-09-29 | Discharge: 2011-09-29 | Disposition: A | Discharge status: Home / Self Care | Payer: Medicaid Other | Attending: Emergency Medicine | Admitting: Emergency Medicine

## 2011-09-29 ENCOUNTER — Encounter (HOSPITAL_COMMUNITY): Payer: Self-pay

## 2011-09-29 ENCOUNTER — Other Ambulatory Visit: Payer: Self-pay

## 2011-09-29 DIAGNOSIS — Z8619 Personal history of other infectious and parasitic diseases: Secondary | ICD-10-CM | POA: Insufficient documentation

## 2011-09-29 DIAGNOSIS — R42 Dizziness and giddiness: Secondary | ICD-10-CM | POA: Insufficient documentation

## 2011-09-29 DIAGNOSIS — F411 Generalized anxiety disorder: Secondary | ICD-10-CM | POA: Insufficient documentation

## 2011-09-29 DIAGNOSIS — R21 Rash and other nonspecific skin eruption: Secondary | ICD-10-CM | POA: Insufficient documentation

## 2011-09-29 DIAGNOSIS — F19939 Other psychoactive substance use, unspecified with withdrawal, unspecified: Secondary | ICD-10-CM

## 2011-09-29 HISTORY — DX: Opioid dependence, uncomplicated: F11.20

## 2011-09-29 LAB — CBC
HCT: 45.2 % (ref 39.0–52.0)
Hemoglobin: 14.9 g/dL (ref 13.0–17.0)
MCH: 27.2 pg (ref 26.0–34.0)
MCHC: 33 g/dL (ref 30.0–36.0)
MCV: 82.5 fL (ref 78.0–100.0)
Platelets: 204 K/uL (ref 150–400)
RBC: 5.48 MIL/uL (ref 4.22–5.81)
RDW: 13.3 % (ref 11.5–15.5)
WBC: 5.2 K/uL (ref 4.0–10.5)

## 2011-09-29 LAB — BASIC METABOLIC PANEL
BUN: 16 mg/dL (ref 6–23)
CO2: 25 mEq/L (ref 19–32)
Calcium: 9.7 mg/dL (ref 8.4–10.5)
Creatinine, Ser: 0.82 mg/dL (ref 0.50–1.35)
Glucose, Bld: 101 mg/dL — ABNORMAL HIGH (ref 70–99)

## 2011-09-29 LAB — BASIC METABOLIC PANEL WITH GFR
Chloride: 99 meq/L (ref 96–112)
GFR calc Af Amer: >90 mL/min (ref >90)
GFR calc non Af Amer: >90 mL/min (ref >90)
Potassium: 4.1 meq/L (ref 3.5–5.1)
Sodium: 135 meq/L (ref 135–145)

## 2011-09-29 MED ORDER — ALPRAZOLAM 1 MG PO TABS: 1.0000 mg | ORAL_TABLET | Freq: Three times a day (TID) | ORAL | Status: AC

## 2011-09-29 MED ORDER — ONDANSETRON 8 MG PO TBDP
8.0000 mg | ORAL_TABLET | Freq: Once | ORAL | Status: AC
  Administered 2011-09-29: 8 mg via ORAL
  Filled 2011-09-29: qty 1

## 2011-09-29 MED ORDER — LORAZEPAM 2 MG/ML IJ SOLN
2.0000 mg | Freq: Once | INTRAMUSCULAR | Status: AC
  Administered 2011-09-29: 2 mg via INTRAMUSCULAR
  Filled 2011-09-29: qty 1

## 2011-09-29 MED ORDER — CLONIDINE HCL 0.1 MG PO TABS
0.1000 mg | ORAL_TABLET | Freq: Once | ORAL | Status: AC
  Administered 2011-09-29: 0.1 mg via ORAL
  Filled 2011-09-29: qty 1
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
## 2011-09-29 NOTE — ED Notes (Signed)
Pt has been on methadone 7 years, states he woke up this morning shaky and weak and went to his methadone clinic this am and was refused methadone and was told come to ED for eval.

## 2011-09-29 NOTE — ED Notes (Signed)
Lab at bedside

## 2011-09-29 NOTE — Discharge Instructions (Signed)
 Follow up with your methadone clinic tomorrow.  Also see your [facility] next week to discuss your benzodiazepine dependence.  Only take your medications as prescribed.

## 2011-09-29 NOTE — ED Notes (Signed)
Pt given d/c instructions;

## 2011-10-01 ENCOUNTER — Other Ambulatory Visit: Payer: Self-pay | Admitting: Gastroenterology

## 2011-10-01 ENCOUNTER — Other Ambulatory Visit: Payer: Self-pay | Admitting: Internal Medicine

## 2011-11-13 ENCOUNTER — Encounter (HOSPITAL_COMMUNITY): Payer: Self-pay | Admitting: *Deleted

## 2011-11-13 ENCOUNTER — Emergency Department (HOSPITAL_COMMUNITY)
Admission: EM | Admit: 2011-11-13 | Discharge: 2011-11-13 | Discharge status: Absent without leave | Payer: Medicaid Other | Source: Home / Self Care

## 2011-11-13 ENCOUNTER — Emergency Department (HOSPITAL_COMMUNITY)
Admission: EM | Admit: 2011-11-13 | Discharge: 2011-11-13 | Discharge status: Against Medical Advice | Payer: Medicaid Other | Attending: Emergency Medicine | Admitting: Emergency Medicine

## 2011-11-13 DIAGNOSIS — Z79899 Other long term (current) drug therapy: Secondary | ICD-10-CM | POA: Insufficient documentation

## 2011-11-13 DIAGNOSIS — F19939 Other psychoactive substance use, unspecified with withdrawal, unspecified: Secondary | ICD-10-CM | POA: Insufficient documentation

## 2011-11-13 DIAGNOSIS — R110 Nausea: Secondary | ICD-10-CM | POA: Insufficient documentation

## 2011-11-13 DIAGNOSIS — F1920 Other psychoactive substance dependence, uncomplicated: Secondary | ICD-10-CM | POA: Insufficient documentation

## 2011-11-13 DIAGNOSIS — M549 Dorsalgia, unspecified: Secondary | ICD-10-CM | POA: Insufficient documentation

## 2011-11-13 DIAGNOSIS — F17200 Nicotine dependence, unspecified, uncomplicated: Secondary | ICD-10-CM | POA: Insufficient documentation

## 2011-11-13 DIAGNOSIS — G8929 Other chronic pain: Secondary | ICD-10-CM | POA: Insufficient documentation

## 2011-11-13 DIAGNOSIS — R259 Unspecified abnormal involuntary movements: Secondary | ICD-10-CM | POA: Insufficient documentation

## 2011-11-13 MED ORDER — CARISOPRODOL 350 MG PO TABS: 350.0000 mg | ORAL_TABLET | Freq: Three times a day (TID) | ORAL | Status: AC

## 2011-11-13 MED ORDER — ALPRAZOLAM 1 MG PO TABS: 1.0000 mg | ORAL_TABLET | Freq: Every evening | ORAL | Status: DC | PRN

## 2011-11-13 MED ORDER — ALPRAZOLAM 0.5 MG PO TABS
1.0000 mg | ORAL_TABLET | Freq: Once | ORAL | Status: AC
  Administered 2011-11-13: 1 mg via ORAL
  Filled 2011-11-13 (×2): qty 1

## 2011-11-13 MED ORDER — OXYCODONE HCL 30 MG PO TB12: 30.0000 mg | ORAL_TABLET | Freq: Two times a day (BID) | ORAL | Status: DC
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
## 2011-11-13 NOTE — ED Notes (Signed)
Pt reports being here to get medication refill of his xanax and pain medicine. Pt was off of these medicine x 5 days but found some this today and went ahead and took them. Pt's arm is red, inflammation noted, and reports that his skin condition has been like this for a year, currently being treated under care of [facility].

## 2011-11-13 NOTE — Discharge Instructions (Signed)
Follow up with your pain management physician, for further prescriptions and long-term care.

## 2011-11-13 NOTE — ED Notes (Signed)
Pt in stating he has been off his xanax and methadone x4 days, states he was unable to get them refilled

## 2011-11-14 ENCOUNTER — Encounter (HOSPITAL_COMMUNITY): Payer: Self-pay

## 2011-11-14 ENCOUNTER — Emergency Department (HOSPITAL_COMMUNITY)
Admission: EM | Admit: 2011-11-14 | Discharge: 2011-11-14 | Disposition: A | Discharge status: Home / Self Care | Payer: Medicaid Other | Source: Home / Self Care | Attending: Emergency Medicine | Admitting: Emergency Medicine

## 2011-11-14 ENCOUNTER — Emergency Department (HOSPITAL_COMMUNITY)
Admission: EM | Admit: 2011-11-14 | Discharge: 2011-11-14 | Disposition: A | Discharge status: Home / Self Care | Payer: Medicaid Other | Attending: Emergency Medicine | Admitting: Emergency Medicine

## 2011-11-14 DIAGNOSIS — Z760 Encounter for issue of repeat prescription: Secondary | ICD-10-CM

## 2011-11-14 DIAGNOSIS — Z79899 Other long term (current) drug therapy: Secondary | ICD-10-CM | POA: Insufficient documentation

## 2011-11-14 DIAGNOSIS — G8929 Other chronic pain: Secondary | ICD-10-CM

## 2011-11-14 DIAGNOSIS — F17200 Nicotine dependence, unspecified, uncomplicated: Secondary | ICD-10-CM | POA: Insufficient documentation

## 2011-11-14 DIAGNOSIS — M549 Dorsalgia, unspecified: Secondary | ICD-10-CM | POA: Insufficient documentation

## 2011-11-14 DIAGNOSIS — F411 Generalized anxiety disorder: Secondary | ICD-10-CM | POA: Insufficient documentation

## 2011-11-14 MED ORDER — OXYCODONE HCL 30 MG PO TABS: 30.0000 mg | ORAL_TABLET | ORAL | Status: AC | PRN

## 2011-11-14 NOTE — Discharge Instructions (Signed)
Medication Refill, Emergency Department °We have refilled your medication today as a courtesy to you. It is best for your medical care, however, to take care of getting refills done through your primary caregiver's office. They have your records and can do a better job of follow-up than we can in the emergency department. °On maintenance medications, we often only prescribe enough medications to get you by until you are able to see your regular caregiver. This is a more expensive way to refill medications. °In the future, please plan for refills so that you will not have to use the emergency department for this. °Thank you for your help. Your help allows us to better take care of the daily emergencies that enter our department. °Document Released: 12/31/2003 Document Revised: 05/26/2011 Document Reviewed: 09/13/2005 °[employer]® Patient Information ©2012 [employer]. °
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
## 2011-11-14 NOTE — ED Notes (Signed)
Patient was seen last pm for possible xanax and oxycodone prescriptions. Patient reports that he received rx for OxyContin and it was over  400 dollars. Patient reports that he has hepatitis c and is here for a change in prescription...wants his oxycodone.

## 2011-11-14 NOTE — Discharge Instructions (Signed)
You will need to bring your prescription with you to have your medication changed. It is best to have a primary care doctor or pain management doctor managing your pain medications. Please return to the ER as needed.  Chronic Pain Management Managing chronic pain is not easy. The goal is to provide as much pain relief as possible. There are emotional as well as physical problems. Chronic pain may lead to symptoms of depression which magnify those of the pain. Problems may include:  Anxiety.   Sleep disturbances.   Confused thinking.   Feeling cranky.   Fatigue.   Weight gain or loss.  Identify the source of the pain first, if possible. The pain may be masking another problem. Try to find a pain management specialist or clinic. Work with a team to create a treatment plan for you. MEDICATIONS  May include narcotics or opioids. Larger than normal doses may be needed to control your pain.   Drugs for depression may help.   Over-the-counter medicines may help for some conditions. These drugs may be used along with others for better pain relief.   May be injected into sites such as the spine and joints. Injections may have to be repeated if they wear off.  THERAPY MAY INCLUDE:  Working with a physical therapist to keep from getting stiff.   Regular, gentle exercise.   Cognitive or behavioral therapy.   Using complementary or integrative medicine such as:   Acupuncture.   Massage, Reiki, or Rolfing.   Aroma, color, light, or sound therapy.   Group support.  FOR MORE INFORMATION [email or web address]. American [email or web address].[email or web address]. Document Released: 10/21/2004 Document Revised: 05/26/2011 Document Reviewed: 11/30/2007 [facility] Patient Information 2012 [place], [place].

## 2011-11-15 NOTE — ED Provider Notes (Signed)
Medical screening examination/treatment/procedure(s) were performed by non-physician practitioner and as supervising physician I was immediately available for consultation/collaboration.   Dione Booze, MD 11/15/11 [phone]

## 2011-11-15 NOTE — ED Provider Notes (Signed)
Medical screening examination/treatment/procedure(s) were performed by non-physician practitioner and as supervising physician I was immediately available for consultation/collaboration.   Kaushal Vannice, MD 11/15/11 2252 

## 2011-11-18 NOTE — Telephone Encounter (Signed)
completed

## 2011-11-21 ENCOUNTER — Emergency Department (HOSPITAL_COMMUNITY)
Admission: EM | Admit: 2011-11-21 | Discharge: 2011-11-21 | Disposition: A | Discharge status: Home / Self Care | Payer: Medicaid Other | Attending: Emergency Medicine | Admitting: Emergency Medicine

## 2011-11-21 ENCOUNTER — Encounter (HOSPITAL_COMMUNITY): Payer: Self-pay | Admitting: Adult Health

## 2011-11-21 DIAGNOSIS — Z8619 Personal history of other infectious and parasitic diseases: Secondary | ICD-10-CM | POA: Insufficient documentation

## 2011-11-21 DIAGNOSIS — M79609 Pain in unspecified limb: Secondary | ICD-10-CM | POA: Insufficient documentation

## 2011-11-21 DIAGNOSIS — M79604 Pain in right leg: Secondary | ICD-10-CM

## 2011-11-21 DIAGNOSIS — Z79899 Other long term (current) drug therapy: Secondary | ICD-10-CM | POA: Insufficient documentation

## 2011-11-21 DIAGNOSIS — F17200 Nicotine dependence, unspecified, uncomplicated: Secondary | ICD-10-CM | POA: Insufficient documentation
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
## 2011-11-21 NOTE — ED Notes (Signed)
Pt states he is on methadone and unable to pay for it last dose was weds. C/o knee and leg pain on both legs.

## 2011-11-21 NOTE — ED Provider Notes (Signed)
Medical screening examination/treatment/procedure(s) were performed by non-physician practitioner and as supervising physician I was immediately available for consultation/collaboration.   Lyanne Co, MD 11/22/11 0000

## 2011-12-02 NOTE — ED Provider Notes (Signed)
Medical screening examination/treatment/procedure(s) were performed by non-physician practitioner and as supervising physician I was immediately available for consultation/collaboration.   Dione Booze, MD 12/02/11 1415

## 2011-12-05 ENCOUNTER — Other Ambulatory Visit: Payer: Self-pay | Admitting: Gastroenterology

## 2011-12-14 ENCOUNTER — Encounter: Payer: Self-pay | Admitting: Gastroenterology

## 2012-01-12 ENCOUNTER — Emergency Department (HOSPITAL_COMMUNITY)
Admission: EM | Admit: 2012-01-12 | Discharge: 2012-01-12 | Discharge status: Against Medical Advice | Payer: Medicaid Other

## 2012-03-10 ENCOUNTER — Other Ambulatory Visit: Payer: Self-pay | Admitting: Dermatology

## 2012-04-05 ENCOUNTER — Other Ambulatory Visit: Payer: Self-pay | Admitting: Gastroenterology

## 2012-04-29 ENCOUNTER — Emergency Department (HOSPITAL_COMMUNITY): Payer: Medicaid Other

## 2012-04-29 ENCOUNTER — Emergency Department (HOSPITAL_COMMUNITY)
Admission: EM | Admit: 2012-04-29 | Discharge: 2012-04-29 | Disposition: A | Discharge status: Home / Self Care | Payer: Medicaid Other | Attending: Emergency Medicine | Admitting: Emergency Medicine

## 2012-04-29 ENCOUNTER — Encounter (HOSPITAL_COMMUNITY): Payer: Self-pay | Admitting: *Deleted

## 2012-04-29 DIAGNOSIS — S0990XA Unspecified injury of head, initial encounter: Secondary | ICD-10-CM | POA: Insufficient documentation

## 2012-04-29 DIAGNOSIS — Z9089 Acquired absence of other organs: Secondary | ICD-10-CM | POA: Insufficient documentation

## 2012-04-29 DIAGNOSIS — T148XXA Other injury of unspecified body region, initial encounter: Secondary | ICD-10-CM

## 2012-04-29 DIAGNOSIS — W19XXXA Unspecified fall, initial encounter: Secondary | ICD-10-CM

## 2012-04-29 DIAGNOSIS — G8929 Other chronic pain: Secondary | ICD-10-CM | POA: Insufficient documentation

## 2012-04-29 DIAGNOSIS — W1789XA Other fall from one level to another, initial encounter: Secondary | ICD-10-CM | POA: Insufficient documentation

## 2012-04-29 DIAGNOSIS — Y92009 Unspecified place in unspecified non-institutional (private) residence as the place of occurrence of the external cause: Secondary | ICD-10-CM | POA: Insufficient documentation

## 2012-04-29 DIAGNOSIS — S62109A Fracture of unspecified carpal bone, unspecified wrist, initial encounter for closed fracture: Secondary | ICD-10-CM

## 2012-04-29 DIAGNOSIS — F17200 Nicotine dependence, unspecified, uncomplicated: Secondary | ICD-10-CM | POA: Insufficient documentation

## 2012-04-29 LAB — CBC WITH DIFFERENTIAL/PLATELET
Basophils Relative: 0 % (ref 0–1)
Hemoglobin: 13 g/dL (ref 13.0–17.0)
Lymphs Abs: 2.9 10*3/uL (ref 0.7–4.0)
MCHC: 33.9 g/dL (ref 30.0–36.0)
Monocytes Relative: 11 % (ref 3–12)
Neutro Abs: 4.5 10*3/uL (ref 1.7–7.7)
Neutrophils Relative %: 53 % (ref 43–77)
RBC: 4.34 MIL/uL (ref 4.22–5.81)

## 2012-04-29 LAB — POCT I-STAT, CHEM 8
Chloride: 106 mEq/L (ref 96–112)
Glucose, Bld: 100 mg/dL — ABNORMAL HIGH (ref 70–99)
HCT: 39 % (ref 39.0–52.0)
Potassium: 4.1 mEq/L (ref 3.5–5.1)
Sodium: 139 mEq/L (ref 135–145)

## 2012-04-29 LAB — POCT I-STAT TROPONIN I

## 2012-04-29 MED ORDER — HYDROMORPHONE HCL PF 1 MG/ML IJ SOLN
0.5000 mg | Freq: Once | INTRAMUSCULAR | Status: AC
  Administered 2012-04-29: 0.5 mg via INTRAVENOUS
  Filled 2012-04-29: qty 1

## 2012-04-29 MED ORDER — OXYCODONE-ACETAMINOPHEN 5-325 MG PO TABS: 1.0000 | ORAL_TABLET | ORAL | Status: DC | PRN

## 2012-04-29 MED ORDER — OXYCODONE-ACETAMINOPHEN 5-325 MG PO TABS
2.0000 | ORAL_TABLET | Freq: Once | ORAL | Status: AC
  Administered 2012-04-29: 2 via ORAL
  Filled 2012-04-29: qty 2

## 2012-04-29 MED ORDER — SODIUM CHLORIDE 0.9 % IV SOLN
Freq: Once | INTRAVENOUS | Status: AC
  Administered 2012-04-29: 01:00:00 via INTRAVENOUS

## 2012-04-29 NOTE — ED Notes (Signed)
1st attempt to obtain labs, pt refused, stated he is in too much pain and will allow labs to be drawn once pain tolerance is reached. RN aware.

## 2012-04-29 NOTE — ED Notes (Signed)
IV placement attepmted x2 unsuccessful. 2nd RN to attempt. [employer], [employer]

## 2012-04-29 NOTE — ED Provider Notes (Signed)
ECG shows normal sinus rhythm with a rate of 82, no ectopy. Normal axis. Normal P wave. Normal QRS. Normal intervals. Normal ST and T waves. Impression: normal ECG. When compared with ECG of 09/29/2011, no significant changes are seen.   Dione Booze, MD 04/29/12 [phone]

## 2012-04-29 NOTE — ED Notes (Signed)
Pt fell from 6 ft high porch. Pt stood and was turning to go inside and next thing he knew he was falling through the air. He states she hit his head and left side first [employer], [employer]
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
## 2012-04-29 NOTE — ED Provider Notes (Signed)
Medical screening examination/treatment/procedure(s) were performed by non-physician practitioner and as supervising physician I was immediately available for consultation/collaboration.   Amalie Koran, MD 04/29/12 2252 

## 2012-05-01 ENCOUNTER — Other Ambulatory Visit (HOSPITAL_COMMUNITY): Payer: Self-pay | Admitting: Orthopaedic Surgery

## 2012-05-02 ENCOUNTER — Encounter (HOSPITAL_COMMUNITY): Payer: Self-pay | Admitting: Pharmacy Technician

## 2012-05-03 ENCOUNTER — Ambulatory Visit (HOSPITAL_COMMUNITY)
Admission: RE | Admit: 2012-05-03 | Discharge: 2012-05-03 | Disposition: A | Discharge status: Home / Self Care | Payer: Medicaid Other | Source: Ambulatory Visit | Attending: Orthopaedic Surgery | Admitting: Orthopaedic Surgery

## 2012-05-03 ENCOUNTER — Encounter (HOSPITAL_COMMUNITY)
Admission: RE | Admit: 2012-05-03 | Discharge: 2012-05-03 | Disposition: A | Discharge status: Home / Self Care | Payer: Medicaid Other | Source: Ambulatory Visit | Attending: Orthopaedic Surgery | Admitting: Orthopaedic Surgery

## 2012-05-03 ENCOUNTER — Encounter (HOSPITAL_COMMUNITY): Payer: Self-pay

## 2012-05-03 HISTORY — DX: Essential (primary) hypertension: I10

## 2012-05-03 LAB — CBC
MCV: 87.6 fL (ref 78.0–100.0)
Platelets: 226 10*3/uL (ref 150–400)
RBC: 4.98 MIL/uL (ref 4.22–5.81)
RDW: 13.5 % (ref 11.5–15.5)
WBC: 6.8 10*3/uL (ref 4.0–10.5)

## 2012-05-03 LAB — BASIC METABOLIC PANEL
CO2: 25 mEq/L (ref 19–32)
Chloride: 99 mEq/L (ref 96–112)
Creatinine, Ser: 0.92 mg/dL (ref 0.50–1.35)
GFR calc Af Amer: >90 mL/min (ref >90)
Potassium: 5 mEq/L (ref 3.5–5.1)
Sodium: 136 mEq/L (ref 135–145)

## 2012-05-03 LAB — SURGICAL PCR SCREEN
MRSA, PCR: NEGATIVE
Staphylococcus aureus: NEGATIVE

## 2012-05-03 MED ORDER — CEFAZOLIN SODIUM-DEXTROSE 2-3 GM-% IV SOLR
2.0000 g | INTRAVENOUS | Status: AC
  Administered 2012-05-04: 2 g via INTRAVENOUS
  Filled 2012-05-03: qty 50

## 2012-05-03 NOTE — Pre-Procedure Instructions (Signed)
20 Dimitri Shakespeare [place]  05/03/2012   Your procedure is scheduled on:  05/04/12  Report to [facility] at 830 AM.  Call this number if you have problems the morning of surgery: [phone]   Remember:   Do not eat food:After Midnight.  May have clear liquids:until Midnight .  Clear liquids include soda, tea, black coffee, apple or grape juice, broth.  Take these medicines the morning of surgery with A SIP OF WATER: xanax,nexium,percocet   Do not wear jewelry, make-up or nail polish.  Do not wear lotions, powders, or perfumes. You may wear deodorant.  Do not shave 48 hours prior to surgery. Men may shave face and neck.  Do not bring valuables to the hospital.  Contacts, dentures or bridgework may not be worn into surgery.  Leave suitcase in the car. After surgery it may be brought to your room.  For patients admitted to the hospital, checkout time is 11:00 AM the day of discharge.   Patients discharged the day of surgery will not be allowed to drive home.  Name and phone number of your driver: family  Special Instructions: CHG Shower Use Special Wash: 1/2 bottle night before surgery and 1/2 bottle morning of surgery.   Please read over the following fact sheets that you were given: Pain Booklet, Coughing and Deep Breathing, MRSA Information and Surgical Site Infection Prevention

## 2012-05-03 NOTE — Progress Notes (Signed)
Patient called short stay and informed Nurse that he takes Methadone and questioned Nurse if he could take it before his scheduled surgery tomorrow morning. Nurse reviewed medications and informed patient that Methadone was not a medication that was on his home medication list and that pharmacy tech would need update list tomorrow when he arrived. Patient verbalized understanding. Nurse then called primary nurse whom pre admitted patient, Dia Crawford, RN and she instructed this nurse to inform patient that it was ok for him to take Methadone before surgery tomorrow. Instructions given to patient.

## 2012-05-03 NOTE — Pre-Procedure Instructions (Signed)
20 Dae W Wallick  05/03/2012   Your procedure is scheduled on:  05/04/12  Report to [facility] at 830 AM.  Call this number if you have problems the morning of surgery: [phone]   Remember:   Do not eat food:After Midnight.  May have clear liquids:until Midnight .  Clear liquids include soda, tea, black coffee, apple or grape juice, broth.  Take these medicines the morning of surgery with A SIP OF WATER: xanax,nexium,percocet   Do not wear jewelry, make-up or nail polish.  Do not wear lotions, powders, or perfumes. You may wear deodorant.  Do not shave 48 hours prior to surgery. Men may shave face and neck.  Do not bring valuables to the hospital.  Contacts, dentures or bridgework may not be worn into surgery.  Leave suitcase in the car. After surgery it may be brought to your room.  For patients admitted to the hospital, checkout time is 11:00 AM the day of discharge.   Patients discharged the day of surgery will not be allowed to drive home.  Name and phone number of your driver: family  Special Instructions: CHG Shower Use Special Wash: 1/2 bottle night before surgery and 1/2 bottle morning of surgery.   Please read over the following fact sheets that you were given: Pain Booklet, Coughing and Deep Breathing, MRSA Information and Surgical Site Infection Prevention  

## 2012-05-04 ENCOUNTER — Encounter (HOSPITAL_COMMUNITY)
Admission: RE | Disposition: A | Discharge status: Home / Self Care | Payer: Self-pay | Source: Ambulatory Visit | Attending: Orthopaedic Surgery

## 2012-05-04 ENCOUNTER — Encounter (HOSPITAL_COMMUNITY): Payer: Self-pay | Admitting: Anesthesiology

## 2012-05-04 ENCOUNTER — Ambulatory Visit (HOSPITAL_COMMUNITY)
Admission: RE | Admit: 2012-05-04 | Discharge: 2012-05-05 | Disposition: A | Discharge status: Home / Self Care | Payer: Medicaid Other | Source: Ambulatory Visit | Attending: Orthopaedic Surgery | Admitting: Orthopaedic Surgery

## 2012-05-04 ENCOUNTER — Ambulatory Visit (HOSPITAL_COMMUNITY): Payer: Medicaid Other | Admitting: Anesthesiology

## 2012-05-04 ENCOUNTER — Encounter (HOSPITAL_COMMUNITY): Payer: Self-pay | Admitting: General Practice

## 2012-05-04 ENCOUNTER — Encounter (HOSPITAL_COMMUNITY): Payer: Self-pay | Admitting: *Deleted

## 2012-05-04 DIAGNOSIS — B1920 Unspecified viral hepatitis C without hepatic coma: Secondary | ICD-10-CM | POA: Insufficient documentation

## 2012-05-04 DIAGNOSIS — F411 Generalized anxiety disorder: Secondary | ICD-10-CM | POA: Insufficient documentation

## 2012-05-04 DIAGNOSIS — S52599A Other fractures of lower end of unspecified radius, initial encounter for closed fracture: Secondary | ICD-10-CM | POA: Insufficient documentation

## 2012-05-04 DIAGNOSIS — F17200 Nicotine dependence, unspecified, uncomplicated: Secondary | ICD-10-CM | POA: Insufficient documentation

## 2012-05-04 DIAGNOSIS — S52502A Unspecified fracture of the lower end of left radius, initial encounter for closed fracture: Secondary | ICD-10-CM

## 2012-05-04 DIAGNOSIS — Z01812 Encounter for preprocedural laboratory examination: Secondary | ICD-10-CM | POA: Insufficient documentation

## 2012-05-04 DIAGNOSIS — F1120 Opioid dependence, uncomplicated: Secondary | ICD-10-CM | POA: Insufficient documentation

## 2012-05-04 DIAGNOSIS — F430 Acute stress reaction: Secondary | ICD-10-CM | POA: Insufficient documentation

## 2012-05-04 DIAGNOSIS — M1990 Unspecified osteoarthritis, unspecified site: Secondary | ICD-10-CM

## 2012-05-04 DIAGNOSIS — W1789XA Other fall from one level to another, initial encounter: Secondary | ICD-10-CM | POA: Insufficient documentation

## 2012-05-04 DIAGNOSIS — I10 Essential (primary) hypertension: Secondary | ICD-10-CM | POA: Insufficient documentation

## 2012-05-04 HISTORY — DX: Low back pain: M54.5

## 2012-05-04 HISTORY — DX: Post-traumatic stress disorder, unspecified: F43.10

## 2012-05-04 HISTORY — DX: Unspecified osteoarthritis, unspecified site: M19.90

## 2012-05-04 HISTORY — DX: Other pneumothorax: J93.83

## 2012-05-04 HISTORY — PX: ORIF DISTAL RADIUS FRACTURE: SUR927

## 2012-05-04 HISTORY — DX: Personal history of other medical treatment: Z92.89

## 2012-05-04 HISTORY — DX: Low back pain, unspecified: M54.50

## 2012-05-04 HISTORY — DX: Other chronic pain: G89.29

## 2012-05-04 SURGERY — OPEN REDUCTION INTERNAL FIXATION (ORIF) DISTAL RADIUS FRACTURE
Anesthesia: General | Site: Arm Lower | Laterality: Left

## 2012-05-04 MED ORDER — ALPRAZOLAM 0.5 MG PO TABS
1.0000 mg | ORAL_TABLET | Freq: Four times a day (QID) | ORAL | Status: DC
  Administered 2012-05-04: 1 mg via ORAL
  Filled 2012-05-04: qty 4

## 2012-05-04 MED ORDER — PANTOPRAZOLE SODIUM 40 MG PO TBEC
40.0000 mg | DELAYED_RELEASE_TABLET | Freq: Every day | ORAL | Status: DC
  Administered 2012-05-04: 40 mg via ORAL
  Filled 2012-05-04: qty 1

## 2012-05-04 MED ORDER — DIPHENHYDRAMINE HCL 12.5 MG/5ML PO ELIX: 12.5000 mg | ORAL_SOLUTION | ORAL | Status: DC | PRN

## 2012-05-04 MED ORDER — METOCLOPRAMIDE HCL 5 MG/ML IJ SOLN: 5.0000 mg | Freq: Three times a day (TID) | INTRAMUSCULAR | Status: DC | PRN

## 2012-05-04 MED ORDER — KETOROLAC TROMETHAMINE 10 MG PO TABS
10.0000 mg | ORAL_TABLET | Freq: Three times a day (TID) | ORAL | Status: DC
  Administered 2012-05-04 – 2012-05-05 (×2): 10 mg via ORAL
  Filled 2012-05-04 (×5): qty 1

## 2012-05-04 MED ORDER — MIDAZOLAM HCL 2 MG/2ML IJ SOLN
1.0000 mg | INTRAMUSCULAR | Status: DC | PRN
  Administered 2012-05-04: 2 mg via INTRAVENOUS

## 2012-05-04 MED ORDER — LISINOPRIL 20 MG PO TABS
20.0000 mg | ORAL_TABLET | Freq: Every day | ORAL | Status: DC
  Administered 2012-05-04: 20 mg via ORAL
  Filled 2012-05-04 (×2): qty 1

## 2012-05-04 MED ORDER — MIDAZOLAM HCL 5 MG/5ML IJ SOLN
INTRAMUSCULAR | Status: DC | PRN
  Administered 2012-05-04 (×2): 2 mg via INTRAVENOUS

## 2012-05-04 MED ORDER — MIDAZOLAM HCL 2 MG/2ML IJ SOLN
INTRAMUSCULAR | Status: AC
  Filled 2012-05-04: qty 2

## 2012-05-04 MED ORDER — METHOCARBAMOL 100 MG/ML IJ SOLN
500.0000 mg | Freq: Four times a day (QID) | INTRAVENOUS | Status: DC | PRN
  Filled 2012-05-04: qty 5

## 2012-05-04 MED ORDER — LACTATED RINGERS IV SOLN
INTRAVENOUS | Status: DC | PRN
  Administered 2012-05-04 (×2): via INTRAVENOUS

## 2012-05-04 MED ORDER — HYDROCODONE-ACETAMINOPHEN 5-325 MG PO TABS
ORAL_TABLET | ORAL | Status: AC
  Filled 2012-05-04: qty 2

## 2012-05-04 MED ORDER — OXYCODONE HCL 5 MG PO TABS
5.0000 mg | ORAL_TABLET | ORAL | Status: DC | PRN
  Administered 2012-05-04: 10 mg via ORAL
  Filled 2012-05-04 (×2): qty 2

## 2012-05-04 MED ORDER — SODIUM CHLORIDE 0.9 % IV SOLN
INTRAVENOUS | Status: DC
  Administered 2012-05-04: 18:00:00 via INTRAVENOUS

## 2012-05-04 MED ORDER — CLOBETASOL PROPIONATE 0.05 % EX CREA
1.0000 "application " | TOPICAL_CREAM | CUTANEOUS | Status: DC | PRN
  Filled 2012-05-04: qty 15

## 2012-05-04 MED ORDER — CEFAZOLIN SODIUM 1-5 GM-% IV SOLN
1.0000 g | Freq: Four times a day (QID) | INTRAVENOUS | Status: AC
  Administered 2012-05-04 – 2012-05-05 (×3): 1 g via INTRAVENOUS
  Filled 2012-05-04 (×3): qty 50

## 2012-05-04 MED ORDER — BUPIVACAINE-EPINEPHRINE PF 0.5-1:200000 % IJ SOLN
INTRAMUSCULAR | Status: DC | PRN
  Administered 2012-05-04: 30 mL

## 2012-05-04 MED ORDER — HYDROMORPHONE HCL PF 1 MG/ML IJ SOLN
INTRAMUSCULAR | Status: AC
  Filled 2012-05-04: qty 2

## 2012-05-04 MED ORDER — LIDOCAINE HCL (CARDIAC) 20 MG/ML IV SOLN
INTRAVENOUS | Status: DC | PRN
  Administered 2012-05-04: 100 mg via INTRAVENOUS

## 2012-05-04 MED ORDER — ALPRAZOLAM 0.5 MG PO TABS
1.0000 mg | ORAL_TABLET | Freq: Four times a day (QID) | ORAL | Status: DC
  Administered 2012-05-04 – 2012-05-05 (×2): 2 mg via ORAL
  Filled 2012-05-04: qty 4

## 2012-05-04 MED ORDER — TRIAMCINOLONE ACETONIDE 0.025 % EX CREA
1.0000 "application " | TOPICAL_CREAM | Freq: Four times a day (QID) | CUTANEOUS | Status: DC | PRN
  Filled 2012-05-04: qty 15

## 2012-05-04 MED ORDER — HYDROMORPHONE HCL PF 1 MG/ML IJ SOLN
INTRAMUSCULAR | Status: AC
  Filled 2012-05-04: qty 1

## 2012-05-04 MED ORDER — METHOCARBAMOL 500 MG PO TABS
500.0000 mg | ORAL_TABLET | Freq: Four times a day (QID) | ORAL | Status: DC | PRN
  Administered 2012-05-04 – 2012-05-05 (×3): 500 mg via ORAL
  Filled 2012-05-04 (×4): qty 1

## 2012-05-04 MED ORDER — HYDROMORPHONE HCL PF 1 MG/ML IJ SOLN
0.2500 mg | INTRAMUSCULAR | Status: DC | PRN
  Administered 2012-05-04 (×4): 0.5 mg via INTRAVENOUS

## 2012-05-04 MED ORDER — HYDROMORPHONE HCL PF 1 MG/ML IJ SOLN
INTRAMUSCULAR | Status: DC | PRN
Start: 2012-05-04 — End: 2012-05-04
  Administered 2012-05-04: 1 mg via INTRAVENOUS
  Administered 2012-05-04: 0.5 mg via INTRAVENOUS
  Administered 2012-05-04: .5 mg via INTRAVENOUS
  Administered 2012-05-04: 1 mg via INTRAVENOUS

## 2012-05-04 MED ORDER — MORPHINE SULFATE 2 MG/ML IJ SOLN
INTRAMUSCULAR | Status: AC
  Administered 2012-05-04: 2 mg via INTRAVENOUS
  Filled 2012-05-04: qty 1

## 2012-05-04 MED ORDER — FENTANYL CITRATE 0.05 MG/ML IJ SOLN
INTRAMUSCULAR | Status: DC | PRN
  Administered 2012-05-04 (×4): 50 ug via INTRAVENOUS

## 2012-05-04 MED ORDER — PROPOFOL 10 MG/ML IV EMUL
INTRAVENOUS | Status: DC | PRN
  Administered 2012-05-04: 400 mg via INTRAVENOUS

## 2012-05-04 MED ORDER — HYDROCODONE-ACETAMINOPHEN 5-325 MG PO TABS
1.0000 | ORAL_TABLET | ORAL | Status: DC | PRN
  Administered 2012-05-04 – 2012-05-05 (×4): 2 via ORAL
  Filled 2012-05-04 (×3): qty 2

## 2012-05-04 MED ORDER — 0.9 % SODIUM CHLORIDE (POUR BTL) OPTIME
TOPICAL | Status: DC | PRN
  Administered 2012-05-04: 500 mL

## 2012-05-04 MED ORDER — ONDANSETRON HCL 4 MG PO TABS
4.0000 mg | ORAL_TABLET | Freq: Four times a day (QID) | ORAL | Status: DC | PRN
  Administered 2012-05-04: 4 mg via ORAL
  Filled 2012-05-04: qty 1

## 2012-05-04 MED ORDER — HYDROMORPHONE HCL PF 1 MG/ML IJ SOLN
1.0000 mg | INTRAMUSCULAR | Status: DC | PRN
  Administered 2012-05-04 – 2012-05-05 (×3): 1 mg via INTRAVENOUS
  Filled 2012-05-04 (×4): qty 1

## 2012-05-04 MED ORDER — ZOLPIDEM TARTRATE 5 MG PO TABS: 5.0000 mg | ORAL_TABLET | Freq: Every evening | ORAL | Status: DC | PRN

## 2012-05-04 MED ORDER — ONDANSETRON HCL 4 MG/2ML IJ SOLN: 4.0000 mg | Freq: Once | INTRAMUSCULAR | Status: DC | PRN

## 2012-05-04 MED ORDER — ONDANSETRON HCL 4 MG/2ML IJ SOLN: 4.0000 mg | Freq: Four times a day (QID) | INTRAMUSCULAR | Status: DC | PRN

## 2012-05-04 MED ORDER — MORPHINE SULFATE 2 MG/ML IJ SOLN
2.0000 mg | INTRAMUSCULAR | Status: DC | PRN
  Administered 2012-05-04 (×2): 2 mg via INTRAVENOUS
  Filled 2012-05-04: qty 1

## 2012-05-04 MED ORDER — METOCLOPRAMIDE HCL 10 MG PO TABS: 5.0000 mg | ORAL_TABLET | Freq: Three times a day (TID) | ORAL | Status: DC | PRN

## 2012-05-04 SURGICAL SUPPLY — 54 items
BANDAGE ELASTIC 4 VELCRO ST LF (GAUZE/BANDAGES/DRESSINGS) ×2 IMPLANT
BANDAGE GAUZE ELAST BULKY 4 IN (GAUZE/BANDAGES/DRESSINGS) ×2 IMPLANT
BIT DRILL 2 FAST STEP (BIT) ×1 IMPLANT
BIT DRILL 2.5X4 QC (BIT) ×1 IMPLANT
BNDG CMPR 9X4 STRL LF SNTH (GAUZE/BANDAGES/DRESSINGS) ×1
BNDG ESMARK 4X9 LF (GAUZE/BANDAGES/DRESSINGS) ×2 IMPLANT
CLOTH BEACON ORANGE TIMEOUT ST (SAFETY) ×2 IMPLANT
CORDS BIPOLAR (ELECTRODE) ×2 IMPLANT
COVER SURGICAL LIGHT HANDLE (MISCELLANEOUS) ×2 IMPLANT
CUFF TOURNIQUET SINGLE 18IN (TOURNIQUET CUFF) ×2 IMPLANT
CUFF TOURNIQUET SINGLE 24IN (TOURNIQUET CUFF) IMPLANT
DRAPE OEC MINIVIEW 54X84 (DRAPES) ×2 IMPLANT
DURAPREP 26ML APPLICATOR (WOUND CARE) ×2 IMPLANT
ELECT REM PT RETURN 9FT ADLT (ELECTROSURGICAL) ×2
ELECTRODE REM PT RTRN 9FT ADLT (ELECTROSURGICAL) ×1 IMPLANT
GAUZE XEROFORM 1X8 LF (GAUZE/BANDAGES/DRESSINGS) ×2 IMPLANT
GLOVE BIOGEL PI IND STRL 8 (GLOVE) ×1 IMPLANT
GLOVE BIOGEL PI INDICATOR 8 (GLOVE) ×1
GLOVE ORTHO TXT STRL SZ7.5 (GLOVE) ×2 IMPLANT
GOWN PREVENTION PLUS LG XLONG (DISPOSABLE) IMPLANT
GOWN PREVENTION PLUS XLARGE (GOWN DISPOSABLE) ×2 IMPLANT
GOWN STRL NON-REIN LRG LVL3 (GOWN DISPOSABLE) ×2 IMPLANT
KIT BASIN OR (CUSTOM PROCEDURE TRAY) ×2 IMPLANT
KIT ROOM TURNOVER OR (KITS) ×2 IMPLANT
MANIFOLD NEPTUNE II (INSTRUMENTS) ×2 IMPLANT
NEEDLE 22X1 1/2 (OR ONLY) (NEEDLE) IMPLANT
NS IRRIG 1000ML POUR BTL (IV SOLUTION) ×2 IMPLANT
PACK ORTHO EXTREMITY (CUSTOM PROCEDURE TRAY) ×2 IMPLANT
PAD ARMBOARD 7.5X6 YLW CONV (MISCELLANEOUS) ×4 IMPLANT
PAD CAST 4YDX4 CTTN HI CHSV (CAST SUPPLIES) ×1 IMPLANT
PADDING CAST COTTON 4X4 STRL (CAST SUPPLIES) ×2
PEG SUBCHONDRAL SMOOTH 2.0X18 (Peg) ×1 IMPLANT
PEG SUBCHONDRAL SMOOTH 2.0X20 (Peg) ×2 IMPLANT
PEG SUBCHONDRAL SMOOTH 2.0X22 (Peg) ×1 IMPLANT
PEG SUBCHONDRAL SMOOTH 2.0X24 (Peg) ×1 IMPLANT
PEG SUBCHONDRAL SMOOTH 2.0X26 (Peg) ×1 IMPLANT
PLATE SHORT 24.4X51.3 LT (Plate) ×1 IMPLANT
SCREW BN 12X3.5XNS CORT TI (Screw) IMPLANT
SCREW CORT 3.5X12 (Screw) ×2 IMPLANT
SCREW CORT 3.5X15 (Screw) ×2 IMPLANT
SCREW MULTI DIRECT 20MM (Screw) ×1 IMPLANT
SPONGE GAUZE 4X4 12PLY (GAUZE/BANDAGES/DRESSINGS) ×2 IMPLANT
SPONGE LAP 4X18 X RAY DECT (DISPOSABLE) ×2 IMPLANT
STRIP CLOSURE SKIN 1/2X4 (GAUZE/BANDAGES/DRESSINGS) ×2 IMPLANT
SUCTION FRAZIER TIP 10 FR DISP (SUCTIONS) ×2 IMPLANT
SUT PROLENE 3 0 PS 1 (SUTURE) ×2 IMPLANT
SUT VIC AB 3-0 X1 27 (SUTURE) ×2 IMPLANT
SUT VICRYL 4-0 PS2 18IN ABS (SUTURE) IMPLANT
SYR CONTROL 10ML LL (SYRINGE) IMPLANT
TOWEL OR 17X24 6PK STRL BLUE (TOWEL DISPOSABLE) ×2 IMPLANT
TOWEL OR 17X26 10 PK STRL BLUE (TOWEL DISPOSABLE) ×2 IMPLANT
TUBE CONNECTING 12X1/4 (SUCTIONS) ×2 IMPLANT
UNDERPAD 30X30 INCONTINENT (UNDERPADS AND DIAPERS) ×2 IMPLANT
WATER STERILE IRR 1000ML POUR (IV SOLUTION) ×2 IMPLANT

## 2012-05-04 NOTE — Anesthesia Postprocedure Evaluation (Signed)
  Anesthesia Post-op Note  Patient: Charles [name]  Procedure(s) Performed: Procedure(s) (LRB): OPEN REDUCTION INTERNAL FIXATION (ORIF) DISTAL RADIAL FRACTURE (Left)  Patient Location: PACU  Anesthesia Type: GA combined with regional for post-op pain  Level of Consciousness: awake, alert  and oriented  Airway and Oxygen Therapy: Patient Spontanous Breathing and Patient connected to nasal cannula oxygen  Post-op Pain: mild  Post-op Assessment: Post-op Vital signs reviewed  Post-op Vital Signs: Reviewed  Complications: No apparent anesthesia complications

## 2012-05-04 NOTE — Addendum Note (Signed)
Addendum  created 05/04/12 1338 by Patrick North, CRNA   Modules edited:Charges VN
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
## 2012-05-04 NOTE — Anesthesia Preprocedure Evaluation (Addendum)
Anesthesia Evaluation  Patient identified by MRN, date of birth, ID band Patient awake    Reviewed: Allergy & Precautions, H&P , NPO status , Patient's Chart, lab work & pertinent test results  Airway Mallampati: I TM Distance: >3 FB Neck ROM: Full    Dental  (+) Teeth Intact and Dental Advisory Given   Pulmonary Current Smoker,  breath sounds clear to auscultation        Cardiovascular hypertension, Pt. on medications Rhythm:Regular Rate:Normal     Neuro/Psych PSYCHIATRIC DISORDERS Anxiety    GI/Hepatic GERD-  Medicated,(+)     substance abuse   , Hepatitis -, CMethadone dependant   Endo/Other    Renal/GU      Musculoskeletal   Abdominal   Peds  Hematology   Anesthesia Other Findings   Reproductive/Obstetrics                         Anesthesia Physical Anesthesia Plan  ASA: III  Anesthesia Plan: General   Post-op Pain Management:    Induction: Intravenous  Airway Management Planned: LMA  Additional Equipment:   Intra-op Plan:   Post-operative Plan: Extubation in OR  Informed Consent: I have reviewed the patients History and Physical, chart, labs and discussed the procedure including the risks, benefits and alternatives for the proposed anesthesia with the patient or authorized representative who has indicated his/her understanding and acceptance.   Dental advisory given  Plan Discussed with: CRNA, Anesthesiologist and Surgeon  Anesthesia Plan Comments:         Anesthesia Quick Evaluation

## 2012-05-04 NOTE — Anesthesia Procedure Notes (Signed)
Anesthesia Regional Block:  Supraclavicular block  Pre-Anesthetic Checklist: ,, timeout performed, Correct Patient, Correct Site, Correct Laterality, Correct Procedure, Correct Position, site marked, Risks and benefits discussed,  Surgical consent,  Pre-op evaluation,  At surgeon's request and post-op pain management  Laterality: Left and Upper  Prep: chloraprep       Needles:  Injection technique: Single-shot  Needle Type: Echogenic Needle     Needle Length: 5cm 5 cm Needle Gauge: 22 and 22 G    Additional Needles:  Procedures: ultrasound guided Supraclavicular block Narrative:  Start time: 05/04/2012 10:40 AM End time: 05/04/2012 10:49 AM Injection made incrementally with aspirations every 5 mL.  Performed by: Personally  Anesthesiologist: Sheldon Silvan  Supraclavicular block

## 2012-05-04 NOTE — Brief Op Note (Signed)
05/04/2012  12:00 PM  PATIENT:  Charles [name]  46 y.o. male  PRE-OPERATIVE DIAGNOSIS:  Left intra-articular distal radius fracture  POST-OPERATIVE DIAGNOSIS:  Left intra-articular distal radius fracture  PROCEDURE:  Procedure(s) (LRB): OPEN REDUCTION INTERNAL FIXATION (ORIF) DISTAL RADIAL FRACTURE (Left)  SURGEON:  Surgeon(s) and Role:    * Kathryne Hitch, MD - Primary  PHYSICIAN ASSISTANT:   ASSISTANTS: none   ANESTHESIA:   regional and general  EBL:  Total I/O In: 1000 [I.V.:1000] Out: -   BLOOD ADMINISTERED:none  DRAINS: none   LOCAL MEDICATIONS USED:  NONE  SPECIMEN:  No Specimen  DISPOSITION OF SPECIMEN:  N/A  COUNTS:  YES  TOURNIQUET:   Total Tourniquet Time Documented: Upper Arm (Left) - 40 minutes  DICTATION: .Other Dictation: Dictation Number [id number]  PLAN OF CARE: Admit for overnight observation  PATIENT DISPOSITION:  PACU - hemodynamically stable.   Delay start of Pharmacological VTE agent (>24hrs) due to surgical blood loss or risk of bleeding: no

## 2012-05-04 NOTE — Transfer of Care (Signed)
Immediate Anesthesia Transfer of Care Note  Patient: Charles [name]  Procedure(s) Performed: Procedure(s) (LRB): OPEN REDUCTION INTERNAL FIXATION (ORIF) DISTAL RADIAL FRACTURE (Left)  Patient Location: PACU  Anesthesia Type: General and Regional  Level of Consciousness: awake, alert , oriented and patient cooperative  Airway & Oxygen Therapy: Patient Spontanous Breathing and Patient connected to nasal cannula oxygen  Post-op Assessment: Report given to PACU RN, Post -op Vital signs reviewed and stable and Patient moving all extremities X 4  Post vital signs: Reviewed and stable  Complications: No apparent anesthesia complications

## 2012-05-05 MED ORDER — CYCLOBENZAPRINE HCL 10 MG PO TABS: 10.0000 mg | ORAL_TABLET | Freq: Two times a day (BID) | ORAL | Status: DC | PRN

## 2012-05-05 MED ORDER — OXYCODONE HCL 5 MG PO TABS: 5.0000 mg | ORAL_TABLET | ORAL | Status: AC | PRN

## 2012-05-05 NOTE — Progress Notes (Signed)
Subjective: 1 Day Post-Op Procedure(s) (LRB): OPEN REDUCTION INTERNAL FIXATION (ORIF) DISTAL RADIAL FRACTURE (Left) Patient reports pain as moderate.    Objective: Vital signs in last 24 hours: Temp:  [97.4 F (36.3 C)-98.6 F (37 C)] 98 F (36.7 C) (08/09 0542) Pulse Rate:  [73-103] 79  (08/09 0542) Resp:  [9-35] 19  (08/08 1630) BP: (101-161)/(66-117) 140/79 mmHg (08/09 0542) SpO2:  [94 %-99 %] 99 % (08/09 0542)  Intake/Output from previous day: 08/08 0701 - 08/09 0700 In: 1000 [I.V.:1000] Out: -  Intake/Output this shift:     Basename 05/03/12 1029  HGB 15.0    Basename 05/03/12 1029  WBC 6.8  RBC 4.98  HCT 43.6  PLT 226    Basename 05/03/12 1029  NA 136  K 5.0  CL 99  CO2 25  BUN 14  CREATININE 0.92  GLUCOSE 101*  CALCIUM 9.4   No results found for this basename: LABPT:2,INR:2 in the last 72 hours  Sensation intact distally Intact pulses distally Incision: scant drainage Compartment soft  Assessment/Plan: 1 Day Post-Op Procedure(s) (LRB): OPEN REDUCTION INTERNAL FIXATION (ORIF) DISTAL RADIAL FRACTURE (Left) D/C to home today.  Kathryne Hitch 05/05/2012, 6:47 AM

## 2012-05-05 NOTE — Discharge Summary (Signed)
Patient ID: DABNEY SCHANZ MRN: [id number] DOB/AGE: 10/29/1965 45 y.o.  Admit date: 05/04/2012 Discharge date: 05/05/2012  Admission Diagnoses:  Principal Problem:  *Distal radius fracture, left   Discharge Diagnoses:  Same  Past Medical History  Diagnosis Date  . Benign localized hyperplasia of prostate without urinary obstruction and other lower urinary tract symptoms (LUTS)   . Hypertrophy of prostate with urinary obstruction and other lower urinary tract symptoms (LUTS)   . Personal history of colonic polyps   . Anxiety state, unspecified   . Diarrhea   . Nausea with vomiting   . Family history of colonic polyps   . Esophageal reflux   . Abdominal pain, left lower quadrant   . Infectious diarrhea   . Nausea alone   . Stricture and stenosis of esophagus   . Esophagitis, unspecified   . Duodenitis without mention of hemorrhage   . Dermatophytosis of the body   . Carbuncle and furuncle of unspecified site   . Barrett's esophagus   . Methadone dependence   . Hypertension     [facility]   617  [phone]  . Migraine   . Spontaneous pneumothorax 1984-1988    "3 times"  . History of blood transfusion 1989    "that's how I contracted Hepatitis"  . Acute hepatitis C without mention of hepatic coma 1989  . Chronic lower back pain   . Arthritis 05/04/2012    "just dx'd w/very aggressive form"  . PTSD (post-traumatic stress disorder)     Surgeries: Procedure(s): OPEN REDUCTION INTERNAL FIXATION (ORIF) DISTAL RADIAL FRACTURE on 05/04/2012   Consultants:    Discharged Condition: Improved  Hospital Course: Charles [name] is an 46 y.o. male who was admitted 05/04/2012 for operative treatment ofDistal radius fracture, left. Patient has severe unremitting pain that affects sleep, daily activities, and work/hobbies. After pre-op clearance the patient was taken to the operating room on 05/04/2012 and underwent  Procedure(s): OPEN REDUCTION INTERNAL FIXATION (ORIF) DISTAL  RADIAL FRACTURE.    Patient was given perioperative antibiotics: Anti-infectives     Start     Dose/Rate Route Frequency Ordered Stop   05/04/12 1730   ceFAZolin (ANCEF) IVPB 1 g/50 mL premix        1 g 100 mL/hr over 30 Minutes Intravenous Every 6 hours 05/04/12 1704 05/05/12 0650   05/03/12 1405   ceFAZolin (ANCEF) IVPB 2 g/50 mL premix        2 g 100 mL/hr over 30 Minutes Intravenous 60 min pre-op 05/03/12 1405 05/04/12 1057           Patient was given sequential compression devices, early ambulation, and chemoprophylaxis to prevent DVT.  Patient benefited maximally from hospital stay and there were no complications.    Recent vital signs: Patient Vitals for the past 24 hrs:  BP Temp Temp src Pulse Resp SpO2  05/05/12 0542 140/79 mmHg 98 F (36.7 C) - 79  - 99 %  05/04/12 2111 133/77 mmHg 98.5 F (36.9 C) - 85  - 98 %  05/04/12 1630 - - - 77  19  94 %  05/04/12 1627 144/85 mmHg - - - - -  05/04/12 1615 - - - 79  13  95 %  05/04/12 1612 161/95 mmHg - - - - -  05/04/12 1600 - - - 77  15  95 %  05/04/12 1557 139/87 mmHg - - - - -  05/04/12 1545 - - - 73  17  94 %  05/04/12 1542 148/79 mmHg - - - - -  05/04/12 1530 - - - 74  18  94 %  05/04/12 1527 126/83 mmHg - - - - -  05/04/12 1515 - - - 76  14  95 %  05/04/12 1512 135/85 mmHg - - - - -  05/04/12 1500 - - - 87  24  94 %  05/04/12 1457 139/117 mmHg - - - - -  05/04/12 1445 - - - 74  13  94 %  05/04/12 1442 131/82 mmHg - - - - -  05/04/12 1430 - - - 73  14  94 %  05/04/12 1427 132/76 mmHg - - 79  16  95 %  05/04/12 1415 - - - 76  15  95 %  05/04/12 1412 134/78 mmHg - - 78  14  97 %  05/04/12 1400 - - - 76  18  98 %  05/04/12 1357 143/83 mmHg - - 73  14  98 %  05/04/12 1345 - - - 80  16  98 %  05/04/12 1342 147/86 mmHg - - 79  19  98 %  05/04/12 1330 128/89 mmHg - - 79  17  97 %  05/04/12 1315 143/66 mmHg - - 81  18  98 %  05/04/12 1300 - - - 75  12  97 %  05/04/12 1257 101/80 mmHg - - 76  14  97 %  05/04/12 1245  - - - 73  9  97 %  05/04/12 1242 151/86 mmHg - - 74  10  97 %  05/04/12 1230 - - - 82  13  96 %  05/04/12 1227 140/101 mmHg - - 83  18  96 %  05/04/12 1215 - - - 97  20  94 %  05/04/12 1212 139/114 mmHg 97.4 F (36.3 C) - 101  35  94 %  05/04/12 1050 - - - 92  16  98 %  05/04/12 1028 - - - 83  18  95 %  05/04/12 0817 140/92 mmHg 98.6 F (37 C) Oral 103  18  95 %     Recent laboratory studies:  Basename 05/03/12 1029  WBC 6.8  HGB 15.0  HCT 43.6  PLT 226  NA 136  K 5.0  CL 99  CO2 25  BUN 14  CREATININE 0.92  GLUCOSE 101*  INR --  CALCIUM 9.4     Discharge Medications:   Medication List  As of 05/05/2012  6:52 AM   TAKE these medications         carisoprodol 350 MG tablet   Commonly known as: SOMA   Take 350 mg by mouth 4 (four) times daily.      clobetasol cream 0.05 %   Commonly known as: TEMOVATE   Apply 1 application topically as needed. 5-6 times daily as needed on arms and legs for allergic reaction from Cymbalta      cyclobenzaprine 10 MG tablet   Commonly known as: FLEXERIL   Take 1 tablet (10 mg total) by mouth 2 (two) times daily as needed. Muscle spasms (neck and back)      esomeprazole 40 MG capsule   Commonly known as: NEXIUM   Take 40 mg by mouth 2 (two) times daily.      hydrOXYzine 25 MG tablet   Commonly known as: ATARAX/VISTARIL   Take 50 mg by mouth every 6 (six) hours as needed. Itching  ibuprofen 200 MG tablet   Commonly known as: ADVIL,MOTRIN   Take 400 mg by mouth 3 (three) times daily as needed. General aches and pain      lisinopril 20 MG tablet   Commonly known as: PRINIVIL,ZESTRIL   Take 20 mg by mouth daily.      oxyCODONE 5 MG immediate release tablet   Commonly known as: Oxy IR/ROXICODONE   Take 1 tablet (5 mg total) by mouth every 4 (four) hours as needed.      oxyCODONE-acetaminophen 5-325 MG per tablet   Commonly known as: PERCOCET/ROXICET   Take 2 tablets by mouth every 4 (four) hours.      promethazine 25 MG  tablet   Commonly known as: PHENERGAN   Take 25 mg by mouth every 6 (six) hours as needed. indigestion      silver sulfADIAZINE 1 % cream   Commonly known as: SILVADENE   Apply 1 application topically daily as needed. Applied to wounds. (arms and legs)  For infection from Cymbalta      triamcinolone 0.025 % cream   Commonly known as: KENALOG   Apply 1 application topically 4 (four) times daily as needed. Legs and arms as needed for infection      XANAX 1 MG tablet   Generic drug: ALPRAZolam   Take 1-2 mg by mouth QID. For anxiety/sleep. One tablet three times daily and two tablets at bedtime            Diagnostic Studies: Dg Chest 2 View  05/03/2012  *RADIOLOGY REPORT*  Clinical Data: Preop for ORIF of distal left radial fracture  CHEST - 2 VIEW  Comparison: Chest x-ray of 09/17/2010  Findings: Linear atelectasis or scarring is noted at the lung bases.  No active infiltrate or effusion is seen.  Mediastinal contours are stable.  The heart is within normal limits in size. No bony abnormality is seen.  IMPRESSION: Bibasilar linear atelectasis or scarring.  No active lung disease.  Original Report Authenticated By: Juline Patch, M.D.   Dg Wrist Complete Left  04/29/2012  *RADIOLOGY REPORT*  Clinical Data: Left wrist pain and swelling after fall.  LEFT WRIST - COMPLETE 3+ VIEW  Comparison: None.  Findings: Comminuted fractures of the distal left radial metaphysis with fracture lines extending to the radial carpal and radial ulnar joints.  There is associated left ulnar styloid process fracture. Cortical irregularity along the dorsal aspect of the capitellum suggest nondisplaced patellar fracture.  No focal bone lesion or bone destruction.  There are prominent soft tissue calcifications throughout the hand and wrist, predominantly medially.  This may represent dystrophic calcification or dermatomyositis.  IMPRESSION: Comminuted fractures of the distal left radius with extension of the radial  carpal and radial ulnar joint.  Ulnar styloid process fracture.  Original Report Authenticated By: Marlon Pel, M.D.   Ct Head Wo Contrast  04/29/2012  *RADIOLOGY REPORT*  Clinical Data: Syncope.  The patient fell from 6 feet.  Striking head on the left side.  CT HEAD WITHOUT CONTRAST  Technique:  Contiguous axial images were obtained from the base of the skull through the vertex without contrast.  Comparison: 09/17/2010  Findings: The ventricles and sulci are symmetrical without significant effacement, displacement, or dilatation. No mass effect or midline shift. No abnormal extra-axial fluid collections. The grey-white matter junction is distinct. Basal cisterns are not effaced. No acute intracranial hemorrhage. No depressed skull fractures.  Visualized paranasal sinuses and mastoid air cells are not opacified.  No significant changes since the previous study.  IMPRESSION: No acute intracranial abnormalities.  Original Report Authenticated By: Marlon Pel, M.D.   Dg Shoulder Left  04/29/2012  *RADIOLOGY REPORT*  Clinical Data: Left-sided shoulder pain after fall.  LEFT SHOULDER - 2+ VIEW  Comparison: None.  Findings: Left shoulder appears intact. No evidence of acute fracture or subluxation.  No focal bone lesions.  Bone matrix and cortex appear intact.  No abnormal radiopaque densities in the soft tissues.  Probable old left upper rib fractures.  IMPRESSION: No acute abnormalities.  Original Report Authenticated By: Marlon Pel, M.D.    Disposition: 01-Home or Self Care  Discharge Orders    Future Orders Please Complete By Expires   Diet - low sodium heart healthy      Call MD / Call 911      Comments:   If you experience chest pain or shortness of breath, CALL 911 and be transported to the hospital emergency room.  If you develope a fever above 101 F, pus (white drainage) or increased drainage or redness at the wound, or calf pain, call your surgeon's office.   Constipation  Prevention      Comments:   Drink plenty of fluids.  Prune juice may be helpful.  You may use a stool softener, such as Colace (over the counter) 100 mg twice a day.  Use MiraLax (over the counter) for constipation as needed.   Increase activity slowly as tolerated      Discharge instructions      Comments:   You can get your incision wet in the shower starting Monday 8/12, then daily dry dressing as needed. Increase your wrist motion as comfort allows. Follow-up with Dr. Magnus Ivan in 2 weeks.         SignedKathryne Hitch 05/05/2012, 6:52 AM

## 2012-05-05 NOTE — Op Note (Signed)
NAMESHAFIN, POLLIO NO.:  [id number]  MEDICAL RECORD NO.:  [id number]  LOCATION:  5N09C                        FACILITY:  [facility]  PHYSICIAN:  Vanita Panda. Magnus Ivan, M.D.DATE OF BIRTH:  1966/07/21  DATE OF PROCEDURE:  05/04/2012 DATE OF DISCHARGE:                              OPERATIVE REPORT   PREOPERATIVE DIAGNOSIS:  Left unstable intra-articular distal radius fracture.  POSTOPERATIVE DIAGNOSIS:  Left unstable intra-articular distal radius fracture.  PROCEDURE:  Open reduction and fixation of left distal radius fracture.  IMPLANTS:  [employer] distal volar radial locking plate.  SURGEON:  Vanita Panda. Magnus Ivan, M.D.  ANESTHESIA: 1. Regional left upper extremity block. 2. General.  TOURNIQUET TIME:  39 minutes.  COMPLICATIONS:  None.  INDICATIONS:  Mr. Laymon is a 46 year old gentleman who had a significant fall from height onto his left nondominant wrist.  He was seen in the emergency room and x-ray showed intra-articular three-part distal radius fracture.  He was placed in a splint and given followup in my office.  I recognized the extent of this fracture and intra-articular nature of it and recommended he undergo open reduction and fixation of the fracture.  The risks and benefits of surgery were explained to him in detail and he did wish to proceed with surgery.  PROCEDURE DESCRIPTION:  After informed consent was obtained, appropriate left arm was marked.  Anesthesia was obtained with regional upper extremity left block.  He was brought to the operating room and placed on the operating table with the left arm on the radiolucent arm table. General anesthesia was then obtained.  Nonsterile tourniquet was placed on his upper left arm and his left arm was prepped and draped with DuraPrep and sterile drapes.  Time-out was called and he was identified as the correct patient and correct left arm.  I then used an Esmarch to wrap  out the wrist and tourniquet was inflated to 250 mm pressure.  I then made an incision on the volar aspect of his wrist and dissected down to the interval between the flexor carpi radialis tendon and the radial artery.  With protecting these structures, I dissected down the pronator quadratus and then divided this from radial to ulnar direction exposing the fracture.  I was able to tease the fracture fragments in the place and then chose a standard width, short length DVR plate from [employer].  I secured this proximally with bicortical screws and distally with locking screws and pegs.  This was all done under direct visualization and direct fluoroscopy, and I was pleased with the alignment of the wrist following this.  I put wrist through gentle flexion, extension, pronation, and supination.  There was no blocks to these and the carpals bones remained in normal alignment.  I then copiously irrigated the wound with normal saline solution and closed the superficial tissue with interrupted 3-0 Vicryl suture followed by interrupted 3-0 nylon for the skin.  Xeroform and a well- padded sterile dressing was applied.  The patient was awakened, extubated, and taken to recovery room in stable condition.  All final counts were correct.  There were no complications noted.     Vanita Panda. Magnus Ivan, M.D.  CYB/MEDQ  D:  05/04/2012  T:  05/05/2012  Job:  [id number]

## 2012-05-25 ENCOUNTER — Telehealth: Payer: Self-pay | Admitting: *Deleted

## 2012-05-25 NOTE — Telephone Encounter (Signed)
Pharmacy said he was trying to get his prescription 5 days too early  Called patient to inform him

## 2012-06-13 ENCOUNTER — Encounter: Payer: Self-pay | Admitting: Gastroenterology

## 2012-06-14 ENCOUNTER — Encounter: Payer: Self-pay | Admitting: Gastroenterology

## 2012-07-03 ENCOUNTER — Other Ambulatory Visit: Payer: Self-pay | Admitting: Gastroenterology

## 2012-07-12 ENCOUNTER — Ambulatory Visit: Payer: Medicaid Other | Admitting: Gastroenterology

## 2012-08-08 ENCOUNTER — Encounter: Payer: Self-pay | Admitting: Gastroenterology

## 2012-08-10 ENCOUNTER — Ambulatory Visit
Admission: RE | Admit: 2012-08-10 | Discharge: 2012-08-10 | Disposition: A | Discharge status: Home / Self Care | Payer: Medicaid Other | Source: Ambulatory Visit | Attending: Nephrology | Admitting: Nephrology

## 2012-08-10 ENCOUNTER — Other Ambulatory Visit (INDEPENDENT_AMBULATORY_CARE_PROVIDER_SITE_OTHER): Payer: Medicaid Other

## 2012-08-10 ENCOUNTER — Other Ambulatory Visit: Payer: Self-pay | Admitting: Nephrology

## 2012-08-10 ENCOUNTER — Encounter: Payer: Self-pay | Admitting: Gastroenterology

## 2012-08-10 ENCOUNTER — Ambulatory Visit (INDEPENDENT_AMBULATORY_CARE_PROVIDER_SITE_OTHER): Payer: Medicaid Other | Admitting: Gastroenterology

## 2012-08-10 VITALS — BP 124/68 | HR 88 | Ht 70.75 in | Wt 214.4 lb

## 2012-08-10 DIAGNOSIS — S52599A Other fractures of lower end of unspecified radius, initial encounter for closed fracture: Secondary | ICD-10-CM

## 2012-08-10 DIAGNOSIS — S52502A Unspecified fracture of the lower end of left radius, initial encounter for closed fracture: Secondary | ICD-10-CM

## 2012-08-10 DIAGNOSIS — F17200 Nicotine dependence, unspecified, uncomplicated: Secondary | ICD-10-CM

## 2012-08-10 DIAGNOSIS — K222 Esophageal obstruction: Secondary | ICD-10-CM

## 2012-08-10 DIAGNOSIS — K2270 Barrett's esophagus without dysplasia: Secondary | ICD-10-CM

## 2012-08-10 DIAGNOSIS — R1011 Right upper quadrant pain: Secondary | ICD-10-CM

## 2012-08-10 DIAGNOSIS — B182 Chronic viral hepatitis C: Secondary | ICD-10-CM

## 2012-08-10 LAB — COMPREHENSIVE METABOLIC PANEL
ALT: 24 U/L (ref 0–53)
CO2: 22 mEq/L (ref 19–32)
Calcium: 8.9 mg/dL (ref 8.4–10.5)
Chloride: 105 mEq/L (ref 96–112)
Creatinine, Ser: 1.2 mg/dL (ref 0.4–1.5)
GFR: 69.63 mL/min (ref >60.00)
Glucose, Bld: 91 mg/dL (ref 70–99)
Total Protein: 7.8 g/dL (ref 6.0–8.3)

## 2012-08-10 LAB — AMYLASE: Amylase: 60 U/L (ref 27–131)

## 2012-08-10 LAB — HEPATIC FUNCTION PANEL
ALT: 24 U/L (ref 0–53)
AST: 25 U/L (ref 0–37)
Bilirubin, Direct: 0.1 mg/dL (ref 0.0–0.3)
Total Bilirubin: 0.5 mg/dL (ref 0.3–1.2)

## 2012-08-10 MED ORDER — HYDROCODONE-ACETAMINOPHEN 5-500 MG PO TABS: 1.0000 | ORAL_TABLET | ORAL | Status: DC | PRN

## 2012-08-10 NOTE — Patient Instructions (Addendum)
We have scheduled you for your procedure Separate instructions have been given You will go to the basement for labs today We have given you  A printed prescription of Vicodin Your abdominal ultrasound has been scheduled for 08/14/2012 at 11am.  At [facility] Nothing to eat or drink after midnight Arrive 15 minutes early for your appointment

## 2012-08-10 NOTE — Assessment & Plan Note (Signed)
Patient is complaining of dysphagia again, likely due to a recurrent stricture  Recommendation #1 upper endoscopy with dilatation as indicated

## 2012-08-10 NOTE — Assessment & Plan Note (Deleted)
Abdominal pain is suggestive of biliary colic  Recommendations #1 check LFTs #2 ultrasound

## 2012-08-10 NOTE — Assessment & Plan Note (Signed)
The patient has declined therapy with interferon in the past. I will refer him to the hepatitis clinic for reconsideration since new therapies are available provided that he has a chronic active hepatitis. I will first check a quantitative HCV RNA
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
## 2012-08-10 NOTE — Assessment & Plan Note (Signed)
Plan followup endoscopy 

## 2012-08-14 ENCOUNTER — Ambulatory Visit (HOSPITAL_COMMUNITY): Payer: Medicaid Other

## 2012-08-14 NOTE — Assessment & Plan Note (Signed)
Abdominal pain is suggestive of biliary colic  Recommendations #1 check LFTs #2 ultrasound 

## 2012-08-15 LAB — HEPATITIS C RNA QUANTITATIVE: HCV Quantitative: 11600000 IU/mL — ABNORMAL HIGH (ref <15)

## 2012-08-21 ENCOUNTER — Other Ambulatory Visit: Payer: Self-pay | Admitting: Gastroenterology

## 2012-08-21 DIAGNOSIS — R1011 Right upper quadrant pain: Secondary | ICD-10-CM

## 2012-08-22 ENCOUNTER — Telehealth: Payer: Self-pay | Admitting: *Deleted

## 2012-08-22 MED ORDER — HYDROCODONE-ACETAMINOPHEN 10-500 MG PO TABS: 1.0000 | ORAL_TABLET | Freq: Four times a day (QID) | ORAL | Status: DC | PRN

## 2012-08-22 NOTE — Telephone Encounter (Signed)
Okay to refill 10 mg. When this is ultrasound?

## 2012-08-22 NOTE — Telephone Encounter (Signed)
Dr Arlyce Dice, The pharmacy called and stated that the pt just got 180 Oxycodone filled on 11-18 for twice a day prn They want to know if we still want him to have the hydrocodone we prescribed today

## 2012-08-23 NOTE — Telephone Encounter (Signed)
LEFT MESSAGE FOR PATIENT THAT NEW PAIN MED SCRIPT WAS CANCELLED PER DR KAPLAN PATIENT JUST HAS 180 OXYCODONE FILLED ON 11/18 BY HIS ORTHOPEDIC DOCTOR

## 2012-08-31 ENCOUNTER — Telehealth: Payer: Self-pay | Admitting: Gastroenterology

## 2012-08-31 NOTE — Telephone Encounter (Signed)
Pt does need the US done. Pt scheduled for [place] at [facility] 09/06/12. Pt to arrive at 8:45am for a 9am appt. Pt to be NPO after midnight. Pt aware.

## 2012-09-01 ENCOUNTER — Other Ambulatory Visit: Payer: Self-pay | Admitting: Gastroenterology

## 2012-09-01 MED ORDER — PROMETHAZINE HCL 25 MG PO TABS: 25.0000 mg | ORAL_TABLET | Freq: Four times a day (QID) | ORAL | Status: DC | PRN

## 2012-09-01 NOTE — Telephone Encounter (Signed)
Medication sent to pharmacy  

## 2012-09-06 ENCOUNTER — Ambulatory Visit (HOSPITAL_COMMUNITY)
Admission: RE | Admit: 2012-09-06 | Discharge: 2012-09-06 | Disposition: A | Discharge status: Home / Self Care | Payer: Medicaid Other | Source: Ambulatory Visit | Attending: Gastroenterology | Admitting: Gastroenterology

## 2012-09-06 DIAGNOSIS — R1011 Right upper quadrant pain: Secondary | ICD-10-CM

## 2012-09-06 DIAGNOSIS — R109 Unspecified abdominal pain: Secondary | ICD-10-CM | POA: Insufficient documentation

## 2012-09-06 DIAGNOSIS — K222 Esophageal obstruction: Secondary | ICD-10-CM

## 2012-09-06 DIAGNOSIS — S52502A Unspecified fracture of the lower end of left radius, initial encounter for closed fracture: Secondary | ICD-10-CM

## 2012-09-06 DIAGNOSIS — K2270 Barrett's esophagus without dysplasia: Secondary | ICD-10-CM

## 2012-09-06 DIAGNOSIS — B182 Chronic viral hepatitis C: Secondary | ICD-10-CM

## 2012-09-06 DIAGNOSIS — Z9049 Acquired absence of other specified parts of digestive tract: Secondary | ICD-10-CM | POA: Insufficient documentation

## 2012-09-06 NOTE — Progress Notes (Signed)
Quick Note:  Please inform the patient that ultrasound was normal and to continue current plan of action ______ 

## 2012-09-13 ENCOUNTER — Other Ambulatory Visit: Payer: Self-pay | Admitting: Gastroenterology

## 2012-09-13 NOTE — Telephone Encounter (Signed)
Dr Arlyce Dice, We cancelled this pts pain med because he is getting them from another doctor  See previous telephone note  Are we prescribing this for him??

## 2012-09-14 NOTE — Telephone Encounter (Signed)
We are not prescribing pain medicines for him

## 2012-09-14 NOTE — Telephone Encounter (Signed)
We are not filling this patients medications. He gets his pain meds from his Orthopedic doctor  Per Dr Arlyce Dice. Discussed this with patient previously.

## 2012-09-15 ENCOUNTER — Telehealth: Payer: Self-pay | Admitting: Gastroenterology

## 2012-09-15 NOTE — Telephone Encounter (Signed)
This patient called and asked for pain medication. He has Lortab on his med list.  He is having abdominal pain and is complaining that it really " hurts today".  I advised him I will transfer this to Dr. Marzetta Board nurse and she will have to consult with Dr. Arlyce Dice.

## 2012-09-15 NOTE — Telephone Encounter (Signed)
Left message for pt to call back.  Pt states he had some left over pain medication from the surgeon that he was taking but he is now out. Pt is requesting something else for pain. States he is hurting below his rib cage from the front all the way around to his back on the right side. Spoke with Mike Gip PA and nothing was found on the [place] that would explain the pain the pt is having. Per Mike Gip PA we will not refill the pain medication at this time. Pt instructed to go to the ER to be evaluated if he is having that much pain. Pt aware.

## 2012-09-21 ENCOUNTER — Telehealth: Payer: Self-pay | Admitting: Gastroenterology

## 2012-09-21 NOTE — Telephone Encounter (Signed)
Pt called for pain meds d/t pain in his "mid section that goes to the center of my back on both sides". He called last week for pain meds and was told to go to the ER and there is no record of him going. Dr Arlyce Dice wrote that he is not prescribing meds for this pt. Explained to pt if he is having that much pain, he needs to go to the ER or call his PCP; pt stated understanding.

## 2012-09-26 ENCOUNTER — Other Ambulatory Visit: Payer: Self-pay | Admitting: Gastroenterology

## 2012-09-26 NOTE — Telephone Encounter (Addendum)
Pt was given rx for phengergan on 09/01/12 with 2 refills.  He can not have any more at this time  Left message on machine to call back

## 2012-10-06 ENCOUNTER — Encounter: Payer: Self-pay | Admitting: Gastroenterology

## 2012-10-06 ENCOUNTER — Ambulatory Visit (AMBULATORY_SURGERY_CENTER): Payer: Medicaid Other | Admitting: Gastroenterology

## 2012-10-06 VITALS — BP 118/59 | HR 67 | Temp 97.1°F | Resp 10 | Ht 70.75 in | Wt 214.0 lb

## 2012-10-06 DIAGNOSIS — R1011 Right upper quadrant pain: Secondary | ICD-10-CM

## 2012-10-06 DIAGNOSIS — K222 Esophageal obstruction: Secondary | ICD-10-CM

## 2012-10-06 DIAGNOSIS — K2270 Barrett's esophagus without dysplasia: Secondary | ICD-10-CM

## 2012-10-06 DIAGNOSIS — D130 Benign neoplasm of esophagus: Secondary | ICD-10-CM

## 2012-10-06 MED ORDER — SODIUM CHLORIDE 0.9 % IV SOLN: 500.0000 mL | INTRAVENOUS | Status: DC

## 2012-10-06 MED ORDER — TRAMADOL HCL 50 MG PO TABS: 50.0000 mg | ORAL_TABLET | Freq: Four times a day (QID) | ORAL | Status: DC | PRN

## 2012-10-06 NOTE — Progress Notes (Signed)
Pt has multiple superficial sores on his both right and left arms.  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  The pt said it was okay to start an IV in either arm. Maw

## 2012-10-06 NOTE — Progress Notes (Signed)
Pt stable to RR 
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
## 2012-10-06 NOTE — Patient Instructions (Addendum)
Discharge instructions given with verbal understanding. Handout on a dilatation diet given. Resume previous medications. YOU HAD AN ENDOSCOPIC PROCEDURE TODAY AT THE [facility]: Refer to the procedure report that was given to you for any specific questions about what was found during the examination.  If the procedure report does not answer your questions, please call your gastroenterologist to clarify.  If you requested that your care partner not be given the details of your procedure findings, then the procedure report has been included in a sealed envelope for you to review at your convenience later.  YOU SHOULD EXPECT: Some feelings of bloating in the abdomen. Passage of more gas than usual.  Walking can help get rid of the air that was put into your GI tract during the procedure and reduce the bloating. If you had a lower endoscopy (such as a colonoscopy or flexible sigmoidoscopy) you may notice spotting of blood in your stool or on the toilet paper. If you underwent a bowel prep for your procedure, then you may not have a normal bowel movement for a few days.  DIET: Your first meal following the procedure should be a light meal and then it is ok to progress to your normal diet.  A half-sandwich or bowl of soup is an example of a good first meal.  Heavy or fried foods are harder to digest and may make you feel nauseous or bloated.  Likewise meals heavy in dairy and vegetables can cause extra gas to form and this can also increase the bloating.  Drink plenty of fluids but you should avoid alcoholic beverages for 24 hours.  ACTIVITY: Your care partner should take you home directly after the procedure.  You should plan to take it easy, moving slowly for the rest of the day.  You can resume normal activity the day after the procedure however you should NOT DRIVE or use heavy machinery for 24 hours (because of the sedation medicines used during the test).    SYMPTOMS TO REPORT  IMMEDIATELY: A gastroenterologist can be reached at any hour.  During normal business hours, 8:30 AM to 5:00 PM Monday through Friday, call [phone].  After hours and on weekends, please call the GI answering service at [phone] who will take a message and have the physician on call contact you.   Following upper endoscopy (EGD)  Vomiting of blood or coffee ground material  New chest pain or pain under the shoulder blades  Painful or persistently difficult swallowing  New shortness of breath  Fever of 100F or higher  Black, tarry-looking stools  FOLLOW UP: If any biopsies were taken you will be contacted by phone or by letter within the next 1-3 weeks.  Call your gastroenterologist if you have not heard about the biopsies in 3 weeks.  Our staff will call the home number listed on your records the next business day following your procedure to check on you and address any questions or concerns that you may have at that time regarding the information given to you following your procedure. This is a courtesy call and so if there is no answer at the home number and we have not heard from you through the emergency physician on call, we will assume that you have returned to your regular daily activities without incident.  SIGNATURES/CONFIDENTIALITY: You and/or your care partner have signed paperwork which will be entered into your electronic medical record.  These signatures attest to the fact that that the information   above on your After Visit Summary has been reviewed and is understood.  Full responsibility of the confidentiality of this discharge information lies with you and/or your care-partner. 

## 2012-10-06 NOTE — Op Note (Signed)
[facility] [address] [place], [address]   ENDOSCOPY PROCEDURE REPORT  PATIENT: Charles, [name]  MR#: [id number] BIRTHDATE: 13-Aug-1966 , 46  yrs. old GENDER: Male ENDOSCOPIST: Louis Meckel, MD REFERRED BY:  Fleet Contras, M.D. PROCEDURE DATE:  10/06/2012 PROCEDURE:  EGD w/ biopsy and Maloney dilation of esophagus ASA CLASS:     Class II INDICATIONS:  history of Barrett's esophagus.   Dysphagia. MEDICATIONS: MAC sedation, administered by CRNA, propofol (Diprivan) 200mg  IV, and Robinul 0.2 mg IV TOPICAL ANESTHETIC:  DESCRIPTION OF PROCEDURE: After the risks benefits and alternatives of the procedure were thoroughly explained, informed consent was obtained.  The LB GIF-H180 [id number] endoscope was introduced through the mouth and advanced to the third portion of the duodenum. Without limitations.  The instrument was slowly withdrawn as the mucosa was fully examined.        ESOPHAGUS: A stricture was found at the gastroesophageal junction. The stenosis was traversable with the endoscope.   There was evidence of Barrett's esophagus at the gastroesophageal junction. Multiple biopsies were performed.  The remainder of the upper endoscopy exam was otherwise normal. Retroflexed views revealed no abnormalities.     The scope was then withdrawn from the patient.  A 52 Fr maloney dilator was passed with mild resistance.  There was no heme.  COMPLICATIONS: There were no complications. ENDOSCOPIC IMPRESSION:  1.  Barrett's esophagus 2.  esophageal stricture - s/p maloney dilitation RECOMMENDATIONS: Await biopsy report REPEAT EXAM:  eSigned:  Louis Meckel, MD 10/06/2012 3:00 PM   CC:

## 2012-10-06 NOTE — Progress Notes (Signed)
Kathlene November, CRNA attempted rt hand x1 unsuccessfully.  Iline Oven, CRNA rt a/c x1 unsuccessfully, tried left a/c x1 unsuccessfully. Maw

## 2012-10-09 ENCOUNTER — Telehealth: Payer: Self-pay | Admitting: *Deleted

## 2012-10-09 NOTE — Telephone Encounter (Signed)
  Follow up Call-  Call back number 10/06/2012  Post procedure Call Back phone  # [phone] cell  Permission to leave phone message Yes     Patient questions:  Do you have a fever, pain , or abdominal swelling? no Pain Score  0 *  Have you tolerated food without any problems? yes  Have you been able to return to your normal activities? yes  Do you have any questions about your discharge instructions: Diet   no Medications  no Follow up visit  no  Do you have questions or concerns about your Care? no  Actions: * If pain score is 4 or above: No action needed, pain <4.

## 2012-10-11 ENCOUNTER — Other Ambulatory Visit: Payer: Self-pay | Admitting: Gastroenterology

## 2012-10-11 MED ORDER — PROMETHAZINE HCL 25 MG PO TABS: 25.0000 mg | ORAL_TABLET | Freq: Four times a day (QID) | ORAL | Status: DC | PRN

## 2012-10-11 NOTE — Telephone Encounter (Signed)
Med sent to pharmacy.

## 2012-10-12 ENCOUNTER — Telehealth: Payer: Self-pay | Admitting: *Deleted

## 2012-10-12 NOTE — Telephone Encounter (Signed)
Needs another appointment at [facility] as he was "tied up in court yesterday and missed his appointment"

## 2012-10-12 NOTE — Telephone Encounter (Signed)
Spoke with pt and gave him the phone number for [facility]. Instructed pt to call [phone]. Let him know that he would have to leave a message and they would call him back. Pt verbalized understanding.

## 2012-10-16 ENCOUNTER — Encounter: Payer: Self-pay | Admitting: Gastroenterology

## 2012-11-02 ENCOUNTER — Telehealth: Payer: Self-pay | Admitting: Gastroenterology

## 2012-11-02 NOTE — Telephone Encounter (Signed)
Patient has been trying to reschedule his appointment with [facility] since he could not make the appointment he had in January.  He has been leaving messages and no one from clinic has returned his calls.  He is wants Dr. Arlyce Dice to know and wants to know what he should do since they are not returning his calls.

## 2012-11-02 NOTE — Telephone Encounter (Signed)
Placed call to Lottie Rater at [facility]. States she will call pt today and try to get him in on 11/15/12. Message left for pt. That they will be call him.

## 2012-11-06 ENCOUNTER — Telehealth: Payer: Self-pay | Admitting: Gastroenterology

## 2012-11-06 MED ORDER — PROMETHAZINE HCL 25 MG PO TABS: ORAL_TABLET | ORAL | Status: DC

## 2012-11-06 NOTE — Telephone Encounter (Signed)
Medication sent to pharmacy  

## 2012-11-07 ENCOUNTER — Telehealth: Payer: Self-pay | Admitting: Gastroenterology

## 2012-11-07 NOTE — Telephone Encounter (Signed)
Called No answer, we do not have samples of Phenrgan

## 2012-11-08 NOTE — Telephone Encounter (Signed)
No answer

## 2012-11-10 ENCOUNTER — Encounter (HOSPITAL_COMMUNITY): Payer: Self-pay | Admitting: Emergency Medicine

## 2012-11-10 DIAGNOSIS — Z860100 Personal history of colon polyps, unspecified: Secondary | ICD-10-CM | POA: Insufficient documentation

## 2012-11-10 DIAGNOSIS — F4310 Post-traumatic stress disorder, unspecified: Secondary | ICD-10-CM | POA: Insufficient documentation

## 2012-11-10 DIAGNOSIS — Z87448 Personal history of other diseases of urinary system: Secondary | ICD-10-CM | POA: Insufficient documentation

## 2012-11-10 DIAGNOSIS — G8929 Other chronic pain: Secondary | ICD-10-CM | POA: Insufficient documentation

## 2012-11-10 DIAGNOSIS — K219 Gastro-esophageal reflux disease without esophagitis: Secondary | ICD-10-CM | POA: Insufficient documentation

## 2012-11-10 DIAGNOSIS — F1120 Opioid dependence, uncomplicated: Secondary | ICD-10-CM | POA: Insufficient documentation

## 2012-11-10 DIAGNOSIS — F17200 Nicotine dependence, unspecified, uncomplicated: Secondary | ICD-10-CM | POA: Insufficient documentation

## 2012-11-10 DIAGNOSIS — R509 Fever, unspecified: Secondary | ICD-10-CM | POA: Insufficient documentation

## 2012-11-10 DIAGNOSIS — R51 Headache: Secondary | ICD-10-CM | POA: Insufficient documentation

## 2012-11-10 DIAGNOSIS — Z8709 Personal history of other diseases of the respiratory system: Secondary | ICD-10-CM | POA: Insufficient documentation

## 2012-11-10 DIAGNOSIS — M545 Low back pain, unspecified: Secondary | ICD-10-CM | POA: Insufficient documentation

## 2012-11-10 DIAGNOSIS — R0602 Shortness of breath: Secondary | ICD-10-CM | POA: Insufficient documentation

## 2012-11-10 DIAGNOSIS — J3489 Other specified disorders of nose and nasal sinuses: Secondary | ICD-10-CM | POA: Insufficient documentation

## 2012-11-10 DIAGNOSIS — Z8679 Personal history of other diseases of the circulatory system: Secondary | ICD-10-CM | POA: Insufficient documentation

## 2012-11-10 DIAGNOSIS — Z872 Personal history of diseases of the skin and subcutaneous tissue: Secondary | ICD-10-CM | POA: Insufficient documentation

## 2012-11-10 DIAGNOSIS — IMO0001 Reserved for inherently not codable concepts without codable children: Secondary | ICD-10-CM | POA: Insufficient documentation

## 2012-11-10 DIAGNOSIS — Z8619 Personal history of other infectious and parasitic diseases: Secondary | ICD-10-CM | POA: Insufficient documentation

## 2012-11-10 DIAGNOSIS — F411 Generalized anxiety disorder: Secondary | ICD-10-CM | POA: Insufficient documentation

## 2012-11-10 DIAGNOSIS — R110 Nausea: Secondary | ICD-10-CM | POA: Insufficient documentation

## 2012-11-10 DIAGNOSIS — Z79899 Other long term (current) drug therapy: Secondary | ICD-10-CM | POA: Insufficient documentation

## 2012-11-10 DIAGNOSIS — I10 Essential (primary) hypertension: Secondary | ICD-10-CM | POA: Insufficient documentation

## 2012-11-10 DIAGNOSIS — Z8719 Personal history of other diseases of the digestive system: Secondary | ICD-10-CM | POA: Insufficient documentation

## 2012-11-10 DIAGNOSIS — J40 Bronchitis, not specified as acute or chronic: Secondary | ICD-10-CM | POA: Insufficient documentation

## 2012-11-10 NOTE — ED Notes (Addendum)
Reports productive cough with green blood streaked sputum, nausea, headache, sob, fever, and body aches x 3 days. Took Ibuprofen 800 mg at 7:45pm

## 2012-11-11 ENCOUNTER — Emergency Department (HOSPITAL_COMMUNITY)
Admission: EM | Admit: 2012-11-11 | Discharge: 2012-11-11 | Disposition: A | Discharge status: Home / Self Care | Payer: Medicaid Other | Attending: Emergency Medicine | Admitting: Emergency Medicine

## 2012-11-11 ENCOUNTER — Emergency Department (HOSPITAL_COMMUNITY): Payer: Medicaid Other

## 2012-11-11 DIAGNOSIS — J40 Bronchitis, not specified as acute or chronic: Secondary | ICD-10-CM

## 2012-11-11 DIAGNOSIS — R6889 Other general symptoms and signs: Secondary | ICD-10-CM

## 2012-11-11 LAB — CBC WITH DIFFERENTIAL/PLATELET
Eosinophils Absolute: 0.1 10*3/uL (ref 0.0–0.7)
Eosinophils Relative: 1 % (ref 0–5)
HCT: 36.9 % — ABNORMAL LOW (ref 39.0–52.0)
Lymphocytes Relative: 12 % (ref 12–46)
Lymphs Abs: 0.5 10*3/uL — ABNORMAL LOW (ref 0.7–4.0)
MCH: 29.7 pg (ref 26.0–34.0)
MCV: 89.1 fL (ref 78.0–100.0)
Monocytes Absolute: 0.9 10*3/uL (ref 0.1–1.0)
RBC: 4.14 MIL/uL — ABNORMAL LOW (ref 4.22–5.81)
WBC: 4.3 10*3/uL (ref 4.0–10.5)

## 2012-11-11 LAB — BASIC METABOLIC PANEL
BUN: 9 mg/dL (ref 6–23)
Calcium: 8.5 mg/dL (ref 8.4–10.5)
Creatinine, Ser: 0.89 mg/dL (ref 0.50–1.35)
GFR calc non Af Amer: >90 mL/min (ref >90)
Glucose, Bld: 103 mg/dL — ABNORMAL HIGH (ref 70–99)
Sodium: 135 mEq/L (ref 135–145)

## 2012-11-11 MED ORDER — SODIUM CHLORIDE 0.9 % IV SOLN: INTRAVENOUS | Status: DC

## 2012-11-11 MED ORDER — HYDROMORPHONE HCL PF 1 MG/ML IJ SOLN
1.0000 mg | Freq: Once | INTRAMUSCULAR | Status: AC
  Administered 2012-11-11: 1 mg via INTRAVENOUS
  Filled 2012-11-11: qty 1

## 2012-11-11 MED ORDER — ONDANSETRON HCL 4 MG/2ML IJ SOLN
4.0000 mg | Freq: Once | INTRAMUSCULAR | Status: AC
  Administered 2012-11-11: 4 mg via INTRAVENOUS
  Filled 2012-11-11: qty 2

## 2012-11-11 MED ORDER — MUCINEX DM 30-600 MG PO TB12: 1.0000 | ORAL_TABLET | Freq: Two times a day (BID) | ORAL | Status: DC

## 2012-11-11 MED ORDER — ALBUTEROL SULFATE HFA 108 (90 BASE) MCG/ACT IN AERS: 1.0000 | INHALATION_SPRAY | Freq: Four times a day (QID) | RESPIRATORY_TRACT | Status: DC | PRN

## 2012-11-11 MED ORDER — NAPROXEN 500 MG PO TABS: 500.0000 mg | ORAL_TABLET | Freq: Two times a day (BID) | ORAL | Status: DC

## 2012-11-11 MED ORDER — ACETAMINOPHEN 325 MG PO TABS
650.0000 mg | ORAL_TABLET | Freq: Once | ORAL | Status: AC
  Administered 2012-11-11: 650 mg via ORAL
  Filled 2012-11-11: qty 2

## 2012-11-11 MED ORDER — SODIUM CHLORIDE 0.9 % IV BOLUS (SEPSIS)
1000.0000 mL | Freq: Once | INTRAVENOUS | Status: AC
  Administered 2012-11-11: 1000 mL via INTRAVENOUS

## 2012-11-11 NOTE — ED Notes (Signed)
EDP into room 

## 2012-11-11 NOTE — ED Notes (Signed)
Alert, NAD, calm, interactive, resps e/u, speaking in clear complete sentences, resps e/u, speaking in clear complete sentences. C/o HA, bodyaches, sob, fever, productive cough with bloody sputum, CP, R>L, and intermittant nausea.

## 2012-11-11 NOTE — ED Notes (Signed)
Pt to xray
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
## 2012-11-27 ENCOUNTER — Telehealth: Payer: Self-pay | Admitting: Gastroenterology

## 2012-11-27 NOTE — Telephone Encounter (Signed)
Okay to renew but he needs a followup office visit. Please instruct him to use the medicine according to directions only since he may be taking too much

## 2012-11-27 NOTE — Telephone Encounter (Signed)
Dr Arlyce Dice, This pt is taking more than recommended dose of his phenergan.  He wants a Brittnay Pigman refill but the pharmacy will not refill it  Do you want him to wait or ok them to fill it

## 2012-11-29 NOTE — Telephone Encounter (Signed)
Spoke with patient and made him a follow up appointment on 4-2 wed at 9:45 am      Dr Arlyce Dice, patient wants to know if he can have hydrocodone refilled

## 2012-12-03 ENCOUNTER — Telehealth: Payer: Self-pay | Admitting: Internal Medicine

## 2012-12-03 NOTE — Telephone Encounter (Signed)
Asking for refill Rx of hydrocodone for chronic RUQ pain. Denied - needs to be seen before refills Has appt 12/27/2012  Advised we would see if he could be seen earlier Chart review indicates he may be taking more of these than advised by Dr. Arlyce Dice.

## 2012-12-11 ENCOUNTER — Telehealth: Payer: Self-pay | Admitting: Gastroenterology

## 2012-12-11 NOTE — Telephone Encounter (Signed)
Dr Arlyce Dice, pt wants refill on pain meds... Please advise

## 2012-12-12 MED ORDER — TRAMADOL HCL 50 MG PO TABS: 50.0000 mg | ORAL_TABLET | Freq: Four times a day (QID) | ORAL | Status: DC | PRN

## 2012-12-12 NOTE — Telephone Encounter (Signed)
Pt aware and script sent to the pharmacy. Pt already has OV scheduled.

## 2012-12-12 NOTE — Telephone Encounter (Signed)
OK to refill ultram. He need OV

## 2012-12-18 ENCOUNTER — Telehealth: Payer: Self-pay | Admitting: Gastroenterology

## 2012-12-18 NOTE — Telephone Encounter (Signed)
Called pt to infrom we have not received a request from his pharmacy  Told him he could come get samples if he needed them

## 2012-12-22 ENCOUNTER — Telehealth: Payer: Self-pay | Admitting: *Deleted

## 2012-12-22 ENCOUNTER — Other Ambulatory Visit: Payer: Self-pay | Admitting: *Deleted

## 2012-12-22 MED ORDER — DEXLANSOPRAZOLE 60 MG PO CPDR: 60.0000 mg | DELAYED_RELEASE_CAPSULE | Freq: Every day | ORAL | Status: DC

## 2012-12-22 NOTE — Telephone Encounter (Signed)
Patient was told me could come for Nexium samples.  I looked in all our drug cabinets and we have none.  We are waiting for a supply. I gave the patient Dexilant 60 mg.  Told him to take 1 tab 30 min before breakfast.  Advised him to call us for the next 2 weeks to see if we got our supply in of Nexium.   The patient did see Dr. Arlyce Dice in Jan 2014 and has a follow up with Dr. Arlyce Dice on 12-27-2012.

## 2012-12-27 ENCOUNTER — Ambulatory Visit (INDEPENDENT_AMBULATORY_CARE_PROVIDER_SITE_OTHER): Payer: Medicaid Other | Admitting: Gastroenterology

## 2012-12-27 ENCOUNTER — Telehealth: Payer: Self-pay | Admitting: *Deleted

## 2012-12-27 ENCOUNTER — Encounter: Payer: Self-pay | Admitting: Gastroenterology

## 2012-12-27 VITALS — BP 140/78 | HR 64 | Ht 70.75 in | Wt 191.0 lb

## 2012-12-27 DIAGNOSIS — K2270 Barrett's esophagus without dysplasia: Secondary | ICD-10-CM

## 2012-12-27 DIAGNOSIS — R1011 Right upper quadrant pain: Secondary | ICD-10-CM

## 2012-12-27 DIAGNOSIS — B1710 Acute hepatitis C without hepatic coma: Secondary | ICD-10-CM

## 2012-12-27 DIAGNOSIS — R109 Unspecified abdominal pain: Secondary | ICD-10-CM

## 2012-12-27 MED ORDER — HYDROCODONE-ACETAMINOPHEN 5-325 MG PO TABS: 1.0000 | ORAL_TABLET | Freq: Four times a day (QID) | ORAL | Status: DC | PRN

## 2012-12-27 MED ORDER — PROMETHAZINE HCL 25 MG PO TABS: 25.0000 mg | ORAL_TABLET | Freq: Four times a day (QID) | ORAL | Status: DC | PRN

## 2012-12-27 NOTE — Assessment & Plan Note (Addendum)
Etiology has not been determined. Ultrasound and LFTs are normal. Acalculous cholecystitis remains a consideration. Pain could also be do to adhesions or, perhaps, pinching of nerves related to his prior surgery.  Recommendations #1 HIDA scan

## 2012-12-27 NOTE — Assessment & Plan Note (Signed)
Asian will begin therapy via the hepatitis clinic

## 2012-12-27 NOTE — Assessment & Plan Note (Signed)
Followup endoscopy 2017

## 2012-12-27 NOTE — Telephone Encounter (Signed)
Pt approved for nexium 40 mg bid for 1 year
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
## 2012-12-27 NOTE — Patient Instructions (Addendum)
You have been scheduled for a HIDA scan at [facility] (1st floor) on 01/18/2003 8am. Please arrive 15 minutes prior to your scheduled appointment on 4/23. Make certain not to have anything to eat or drink at least 6 hours prior to your test. Should this appointment date or time not work well for you, please call radiology scheduling at [phone].  _____________________________________________________________________ hepatobiliary (HIDA) scan is an imaging procedure used to diagnose problems in the liver, gallbladder and bile ducts. In the HIDA scan, a radioactive chemical or tracer is injected into a vein in your arm. The tracer is handled by the liver like bile. Bile is a fluid produced and excreted by your liver that helps your digestive system break down fats in the foods you eat. Bile is stored in your gallbladder and the gallbladder releases the bile when you eat a meal. A special nuclear medicine scanner (gamma camera) tracks the flow of the tracer from your liver into your gallbladder and small intestine.  During your HIDA scan  You'll be asked to change into a hospital gown before your HIDA scan begins. Your health care team will position you on a table, usually on your back. The radioactive tracer is then injected into a vein in your arm.The tracer travels through your bloodstream to your liver, where it's taken up by the bile-producing cells. The radioactive tracer travels with the bile from your liver into your gallbladder and through your bile ducts to your small intestine.You may feel some pressure while the radioactive tracer is injected into your vein. As you lie on the table, a special gamma camera is positioned over your abdomen taking pictures of the tracer as it moves through your body. The gamma camera takes pictures continually for about an hour. You'll need to keep still during the HIDA scan. This can become uncomfortable, but you may find that you can lessen the discomfort by taking deep  breaths and thinking about other things. Tell your health care team if you're uncomfortable. The radiologist will watch on a computer the progress of the radioactive tracer through your body. The HIDA scan may be stopped when the radioactive tracer is seen in the gallbladder and enters your small intestine. This typically takes about an hour. In some cases extra imaging will be performed if original images aren't satisfactory, if morphine is given to help visualize the gallbladder or if the medication CCK is given to look at the contraction of the gallbladder. This test typically takes 2 hours to complete. ________________________________________________________________________

## 2013-01-08 ENCOUNTER — Telehealth: Payer: Self-pay | Admitting: Gastroenterology

## 2013-01-08 NOTE — Telephone Encounter (Signed)
Spoke with patient and he states he started having problems on Friday with vomiting. He is now vomiting black color emesis and he states he is having black, tarry stools that are not diarrhea. He also states he has blood in urine. He states he is having abdominal pain and is asking for refill on pain medication. Instructed patient to go to ED now to be evaluated for black stool and emesis.

## 2013-01-08 NOTE — Telephone Encounter (Signed)
Agree 

## 2013-01-09 MED ORDER — TRAMADOL HCL 50 MG PO TABS: ORAL_TABLET | ORAL | Status: DC

## 2013-01-09 NOTE — Telephone Encounter (Signed)
He is yet to have his HIDA scan. No more narcotics.  He can take tramadol 1-2 tabs every 6 hours for pain.

## 2013-01-09 NOTE — Telephone Encounter (Signed)
Patient states he went to [facility] ED and saw an oriental woman and had labs. No record in EPIC of this. Called [facility] ED and patient did not sign in there. He states his bleeding has stopped but he is having abdominal pain. He is asking for refill on his pain medication.He received #30 Vicodin on 12/27/12. Please, advise.

## 2013-01-13 ENCOUNTER — Telehealth: Payer: Self-pay | Admitting: Gastroenterology

## 2013-01-13 NOTE — Telephone Encounter (Signed)
Pt inquired if it's OK to take occasional tylenol#3 while he is taking codeine.  i told him that is acceptable

## 2013-01-17 ENCOUNTER — Ambulatory Visit (HOSPITAL_COMMUNITY)
Admission: RE | Admit: 2013-01-17 | Discharge: 2013-01-17 | Disposition: A | Discharge status: Home / Self Care | Payer: Medicaid Other | Source: Ambulatory Visit | Attending: Gastroenterology | Admitting: Gastroenterology

## 2013-01-17 DIAGNOSIS — R1011 Right upper quadrant pain: Secondary | ICD-10-CM | POA: Insufficient documentation

## 2013-01-17 DIAGNOSIS — R109 Unspecified abdominal pain: Secondary | ICD-10-CM

## 2013-01-17 MED ORDER — TECHNETIUM TC 99M MEBROFENIN IV KIT
5.1000 | PACK | Freq: Once | INTRAVENOUS | Status: AC | PRN
  Administered 2013-01-17: 5 via INTRAVENOUS

## 2013-01-22 ENCOUNTER — Telehealth: Payer: Self-pay | Admitting: Gastroenterology

## 2013-01-22 NOTE — Telephone Encounter (Signed)
Pt called and wants to know if he needs to schedule a follow-up appt. Please advise.

## 2013-01-22 NOTE — Telephone Encounter (Signed)
Yes

## 2013-01-23 NOTE — Telephone Encounter (Signed)
Left message for pt to call back.  Pt scheduled to see Dr. Arlyce Dice 02/05/13@10 :30am. Pt aware of appt date and time.

## 2013-02-05 ENCOUNTER — Encounter: Payer: Self-pay | Admitting: Gastroenterology

## 2013-02-05 ENCOUNTER — Ambulatory Visit (INDEPENDENT_AMBULATORY_CARE_PROVIDER_SITE_OTHER): Payer: Medicaid Other | Admitting: Gastroenterology

## 2013-02-05 VITALS — BP 118/68 | HR 75 | Ht 70.0 in | Wt 168.0 lb

## 2013-02-05 DIAGNOSIS — R1011 Right upper quadrant pain: Secondary | ICD-10-CM

## 2013-02-05 NOTE — Progress Notes (Signed)
History of Present Illness:  The patient continues to complain of severe right upper quadrant pain. Workup has been unrevealing including HIDA scan. 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 He is followed at the hepatitis clinic for hepatitis C.    Review of Systems: Pertinent positive and negative review of systems were noted in the above HPI section. All other review of systems were otherwise negative.    Current Medications, Allergies, Past Medical History, Past Surgical History, Family History and Social History were reviewed in [employer] [facility] electronic medical record  Vital signs were reviewed in today's medical record. Physical Exam: General: Well developed , well nourished, no acute distress On abdominal exam there is mild right upper quadrant tenderness to palpation without guarding or rebound. There are no abdominal masses organomegaly

## 2013-02-05 NOTE — Assessment & Plan Note (Addendum)
Workup including ultrasound, upper endoscopy and HIDA scan were all negative. I suspect pain is related to his incision or secondary to adhesions or abdominal wall pain. At this point he falls into a category of chronic pain syndrome. I will refer him for a second opinion with general surgery and I have suggested that he go back to pain clinic for treatment of chronic pain. Inasmuch as he is taking methadone I have refused to prescribe another narcotic.

## 2013-02-05 NOTE — Patient Instructions (Addendum)
You have been scheduled for an appointment with ___________ at [facility]. Your appointment is on __________ at ___________. Please arrive at ______________ for registration. Make certain to bring a list of current medications, including any over the counter medications or vitamins. Also bring your co-pay if you have one as well as your insurance cards. [facility] is located at [address], [address]. Should you need to reschedule your appointment, please contact them at [phone].  

## 2013-02-07 ENCOUNTER — Telehealth: Payer: Self-pay | Admitting: Gastroenterology

## 2013-02-09 NOTE — Telephone Encounter (Signed)
Pt states the pain clinic is helping him get off of the Methadone and he wanted to know if Dr. Arlyce Dice would write him a script for something to help with withdrawal symptoms. Discussed with pt that we do not write scripts for that and he would need to speak to his PCP or the pain clinic regarding that. Pt verbalized understanding.

## 2013-03-07 ENCOUNTER — Telehealth: Payer: Self-pay | Admitting: Gastroenterology

## 2013-03-07 NOTE — Telephone Encounter (Signed)
Dr. Samella Parr called from the pain clinic. States pt is not to be prescribed any more opiates, benzo's, or soma. 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 Dr. Arlyce Dice notified.

## 2013-03-07 NOTE — Telephone Encounter (Signed)
Ok

## 2013-03-21 ENCOUNTER — Telehealth: Payer: Self-pay | Admitting: Gastroenterology

## 2013-03-21 MED ORDER — PROMETHAZINE HCL 25 MG PO TABS: 25.0000 mg | ORAL_TABLET | Freq: Four times a day (QID) | ORAL | Status: DC | PRN

## 2013-03-21 NOTE — Telephone Encounter (Signed)
Called Patient to inform I sent in his prescription to his pharmacy

## 2013-03-22 ENCOUNTER — Other Ambulatory Visit: Payer: Self-pay | Admitting: *Deleted

## 2013-03-22 MED ORDER — PROMETHAZINE HCL 25 MG PO TABS: 25.0000 mg | ORAL_TABLET | Freq: Four times a day (QID) | ORAL | Status: DC | PRN

## 2013-04-05 ENCOUNTER — Other Ambulatory Visit: Payer: Self-pay | Admitting: *Deleted

## 2013-04-05 MED ORDER — PROMETHAZINE HCL 25 MG PO TABS: 25.0000 mg | ORAL_TABLET | Freq: Four times a day (QID) | ORAL | Status: DC | PRN

## 2013-04-18 ENCOUNTER — Other Ambulatory Visit: Payer: Self-pay | Admitting: Gastroenterology

## 2013-04-18 NOTE — Telephone Encounter (Signed)
Dr Arlyce Dice, Can this patient have this pain med

## 2013-04-19 NOTE — Telephone Encounter (Signed)
He patient was supposed to get a second opinion from surgery and a referral to a pain clinic because of chronic abdominal pain. Before we prescribe any more narcotics I want to make sure that he's not getting meds from another source.

## 2013-04-23 ENCOUNTER — Other Ambulatory Visit: Payer: Self-pay | Admitting: *Deleted

## 2013-04-23 MED ORDER — ESOMEPRAZOLE MAGNESIUM 40 MG PO CPDR: 40.0000 mg | DELAYED_RELEASE_CAPSULE | Freq: Two times a day (BID) | ORAL | Status: DC

## 2013-04-23 NOTE — Telephone Encounter (Signed)
Previous note from Pain Clinic. We are not to prescribe any more pain meds for pt. Patient is on medication for addiction

## 2013-05-04 ENCOUNTER — Telehealth: Payer: Self-pay | Admitting: Gastroenterology

## 2013-05-04 NOTE — Telephone Encounter (Signed)
Spoke with pt and let him know he was referred to [facility]. He was given the phone number of [phone].

## 2013-08-30 ENCOUNTER — Other Ambulatory Visit: Payer: Self-pay | Admitting: Internal Medicine

## 2013-08-30 DIAGNOSIS — C220 Liver cell carcinoma: Secondary | ICD-10-CM

## 2013-09-03 ENCOUNTER — Ambulatory Visit
Admission: RE | Admit: 2013-09-03 | Discharge: 2013-09-03 | Disposition: A | Discharge status: Home / Self Care | Payer: Medicaid Other | Source: Ambulatory Visit | Attending: Internal Medicine | Admitting: Internal Medicine

## 2013-09-03 DIAGNOSIS — C220 Liver cell carcinoma: Secondary | ICD-10-CM

## 2013-12-24 ENCOUNTER — Telehealth: Payer: Self-pay | Admitting: Gastroenterology

## 2013-12-24 MED ORDER — PROMETHAZINE HCL 25 MG PO TABS: 25.0000 mg | ORAL_TABLET | Freq: Four times a day (QID) | ORAL | Status: DC | PRN

## 2013-12-24 MED ORDER — ESOMEPRAZOLE MAGNESIUM 40 MG PO CPDR: 40.0000 mg | DELAYED_RELEASE_CAPSULE | Freq: Two times a day (BID) | ORAL | Status: DC

## 2013-12-24 NOTE — Telephone Encounter (Signed)
Meds sent

## 2014-01-18 ENCOUNTER — Other Ambulatory Visit (HOSPITAL_COMMUNITY): Payer: Self-pay | Admitting: Orthopaedic Surgery

## 2014-01-22 ENCOUNTER — Encounter (HOSPITAL_COMMUNITY): Payer: Self-pay | Admitting: Pharmacy Technician

## 2014-01-24 ENCOUNTER — Other Ambulatory Visit (HOSPITAL_COMMUNITY): Payer: Self-pay | Admitting: *Deleted

## 2014-01-24 ENCOUNTER — Other Ambulatory Visit (HOSPITAL_COMMUNITY): Payer: Medicaid Other

## 2014-01-28 ENCOUNTER — Ambulatory Visit (HOSPITAL_COMMUNITY)
Admission: RE | Admit: 2014-01-28 | Discharge: 2014-01-28 | Disposition: A | Discharge status: Home / Self Care | Payer: Medicaid Other | Source: Ambulatory Visit | Attending: Anesthesiology | Admitting: Anesthesiology

## 2014-01-28 ENCOUNTER — Encounter (HOSPITAL_COMMUNITY): Payer: Self-pay

## 2014-01-28 ENCOUNTER — Encounter (HOSPITAL_COMMUNITY)
Admission: RE | Admit: 2014-01-28 | Discharge: 2014-01-28 | Disposition: A | Discharge status: Home / Self Care | Payer: Medicaid Other | Source: Ambulatory Visit | Attending: Orthopaedic Surgery | Admitting: Orthopaedic Surgery

## 2014-01-28 ENCOUNTER — Encounter (INDEPENDENT_AMBULATORY_CARE_PROVIDER_SITE_OTHER): Payer: Self-pay

## 2014-01-28 DIAGNOSIS — I498 Other specified cardiac arrhythmias: Secondary | ICD-10-CM | POA: Insufficient documentation

## 2014-01-28 DIAGNOSIS — Z01812 Encounter for preprocedural laboratory examination: Secondary | ICD-10-CM | POA: Insufficient documentation

## 2014-01-28 DIAGNOSIS — Z01810 Encounter for preprocedural cardiovascular examination: Secondary | ICD-10-CM | POA: Insufficient documentation

## 2014-01-28 DIAGNOSIS — Z01818 Encounter for other preprocedural examination: Secondary | ICD-10-CM | POA: Insufficient documentation

## 2014-01-28 DIAGNOSIS — I10 Essential (primary) hypertension: Secondary | ICD-10-CM | POA: Insufficient documentation

## 2014-01-28 LAB — BASIC METABOLIC PANEL
BUN: 12 mg/dL (ref 6–23)
CO2: 27 mEq/L (ref 19–32)
Calcium: 9.7 mg/dL (ref 8.4–10.5)
Chloride: 100 mEq/L (ref 96–112)
Creatinine, Ser: 0.74 mg/dL (ref 0.50–1.35)
GFR calc Af Amer: >90 mL/min (ref >90)
Glucose, Bld: 86 mg/dL (ref 70–99)
POTASSIUM: 4.8 meq/L (ref 3.7–5.3)
SODIUM: 139 meq/L (ref 137–147)

## 2014-01-28 LAB — URINALYSIS, ROUTINE W REFLEX MICROSCOPIC
Bilirubin Urine: NEGATIVE
GLUCOSE, UA: NEGATIVE mg/dL
Hgb urine dipstick: NEGATIVE
KETONES UR: NEGATIVE mg/dL
Leukocytes, UA: NEGATIVE
NITRITE: NEGATIVE
PROTEIN: NEGATIVE mg/dL
Specific Gravity, Urine: 1.018 (ref 1.005–1.030)
UROBILINOGEN UA: 0.2 mg/dL (ref 0.0–1.0)
pH: 6 (ref 5.0–8.0)

## 2014-01-28 LAB — CBC
HCT: 43.5 % (ref 39.0–52.0)
Hemoglobin: 15 g/dL (ref 13.0–17.0)
MCH: 31.5 pg (ref 26.0–34.0)
MCHC: 34.5 g/dL (ref 30.0–36.0)
MCV: 91.4 fL (ref 78.0–100.0)
PLATELETS: 169 10*3/uL (ref 150–400)
RBC: 4.76 MIL/uL (ref 4.22–5.81)
RDW: 12.1 % (ref 11.5–15.5)
WBC: 3.8 10*3/uL — ABNORMAL LOW (ref 4.0–10.5)

## 2014-01-28 LAB — PROTIME-INR
INR: 0.93 (ref 0.00–1.49)
Prothrombin Time: 12.3 seconds (ref 11.6–15.2)

## 2014-01-28 LAB — ABO/RH: ABO/RH(D): O POS

## 2014-01-28 LAB — SURGICAL PCR SCREEN
MRSA, PCR: NEGATIVE
Staphylococcus aureus: POSITIVE — AB

## 2014-01-28 LAB — APTT: APTT: 34 s (ref 24–37)

## 2014-01-28 NOTE — Patient Instructions (Addendum)
Charles [name]  01/28/2014   Your procedure is scheduled on: 02/01/14 Friday  Report to [facility] at 810 AM.  Call this number if you have problems the morning of surgery 336-: [phone]   Remember:   Do not eat food or drink liquids After Midnight.     Take these medicines the morning of surgery with A SIP OF WATER: xanax if needed, nexium, hydrocodone if needed   Do not wear jewelry, make-up or nail polish.  Do not wear lotions, powders, or perfumes. You may wear deodorant.  Do not shave 48 hours prior to surgery. Men may shave face and neck.  Do not bring valuables to the hospital.  Contacts, dentures or bridgework may not be worn into surgery.  Leave suitcase in the car. After surgery it may be brought to your room.  For patients admitted to the hospital, checkout time is 11:00 AM the day of discharge.    [facility] - Preparing for Surgery Before surgery, you can play an important role.  Because skin is not sterile, your skin needs to be as free of germs as possible.  You can reduce the number of germs on your skin by washing with CHG (chlorahexidine gluconate) soap before surgery.  CHG is an antiseptic cleaner which kills germs and bonds with the skin to continue killing germs even after washing. Please DO NOT use if you have an allergy to CHG or antibacterial soaps.  If your skin becomes reddened/irritated stop using the CHG and inform your nurse when you arrive at Short Stay. Do not shave (including legs and underarms) for at least 48 hours prior to the first CHG shower.  You may shave your face. Please follow these instructions carefully:  1.  Shower with CHG Soap the night before surgery and the  morning of Surgery.  2.  If you choose to wash your hair, wash your hair first as usual with your  normal  shampoo.  3.  After you shampoo, rinse your hair and body thoroughly to remove the  shampoo.                           4.  Use CHG as you would any other  liquid soap.  You can apply chg directly  to the skin and wash                       Gently with a scrungie or clean washcloth.  5.  Apply the CHG Soap to your body ONLY FROM THE NECK DOWN.   Do not use on open                           Wound or open sores. Avoid contact with eyes, ears mouth and genitals (private parts).                        Genitals (private parts) with your normal soap.             6.  Wash thoroughly, paying special attention to the area where your surgery  will be performed.  7.  Thoroughly rinse your body with warm water from the neck down.  8.  DO NOT shower/wash with your normal soap after using and rinsing off  the CHG Soap.  9.  Pat yourself dry with a clean towel.            10.  Wear clean pajamas.            11.  Place clean sheets on your bed the night of your first shower and do not  sleep with pets. Day of Surgery : Do not apply any lotions/deodorants the morning of surgery.  Please wear clean clothes to the hospital/surgery center.  FAILURE TO FOLLOW THESE INSTRUCTIONS MAY RESULT IN THE CANCELLATION OF YOUR SURGERY PATIENT SIGNATURE_________________________________  NURSE SIGNATURE__________________________________  ________________________________________________________________________  WHAT IS A BLOOD TRANSFUSION? Blood Transfusion Information  A transfusion is the replacement of blood or some of its parts. Blood is made up of multiple cells which provide different functions.  Red blood cells carry oxygen and are used for blood loss replacement.  White blood cells fight against infection.  Platelets control bleeding.  Plasma helps clot blood.  Other blood products are available for specialized needs, such as hemophilia or other clotting disorders. BEFORE THE TRANSFUSION  Who gives blood for transfusions?   Healthy volunteers who are fully evaluated to make sure their blood is safe. This is blood bank blood. Transfusion therapy  is the safest it has ever been in the practice of medicine. Before blood is taken from a donor, a complete history is taken to make sure that person has no history of diseases nor engages in risky social behavior (examples are intravenous drug use or sexual activity with multiple partners). The donor's travel history is screened to minimize risk of transmitting infections, such as malaria. The donated blood is tested for signs of infectious diseases, such as HIV and hepatitis. The blood is then tested to be sure it is compatible with you in order to minimize the chance of a transfusion reaction. If you or a relative donates blood, this is often done in anticipation of surgery and is not appropriate for emergency situations. It takes many days to process the donated blood. RISKS AND COMPLICATIONS Although transfusion therapy is very safe and saves many lives, the main dangers of transfusion include:   Getting an infectious disease.  Developing a transfusion reaction. This is an allergic reaction to something in the blood you were given. Every precaution is taken to prevent this. The decision to have a blood transfusion has been considered carefully by your caregiver before blood is given. Blood is not given unless the benefits outweigh the risks. AFTER THE TRANSFUSION  Right after receiving a blood transfusion, you will usually feel much better and more energetic. This is especially true if your red blood cells have gotten low (anemic). The transfusion raises the level of the red blood cells which carry oxygen, and this usually causes an energy increase.  The nurse administering the transfusion will monitor you carefully for complications. HOME CARE INSTRUCTIONS  No special instructions are needed after a transfusion. You may find your energy is better. Speak with your caregiver about any limitations on activity for underlying diseases you may have. SEEK MEDICAL CARE IF:   Your condition is not  improving after your transfusion.  You develop redness or irritation at the intravenous (IV) site. SEEK IMMEDIATE MEDICAL CARE IF:  Any of the following symptoms occur over the next 12 hours:  Shaking chills.  You have a temperature by mouth above 102 F (38.9 C), not controlled by medicine.  Chest, back, or muscle pain.  People around you feel you are not acting correctly or  are confused.  Shortness of breath or difficulty breathing.  Dizziness and fainting.  You get a rash or develop hives.  You have a decrease in urine output.  Your urine turns a dark color or changes to pink, red, or brown. Any of the following symptoms occur over the next 10 days:  You have a temperature by mouth above 102 F (38.9 C), not controlled by medicine.  Shortness of breath.  Weakness after normal activity.  The white part of the eye turns yellow (jaundice).  You have a decrease in the amount of urine or are urinating less often.  Your urine turns a dark color or changes to pink, red, or brown. Document Released: 09/10/2000 Document Revised: 12/06/2011 Document Reviewed: 04/29/2008 [facility] Patient Information 2014 [place], [place].  _______________________________________________________________________

## 2014-01-29 ENCOUNTER — Telehealth: Payer: Self-pay | Admitting: *Deleted

## 2014-01-29 NOTE — Telephone Encounter (Signed)
[place] # IS [id number] P

## 2014-02-01 ENCOUNTER — Inpatient Hospital Stay (HOSPITAL_COMMUNITY)
Admission: RE | Admit: 2014-02-01 | Discharge: 2014-02-04 | DRG: 470 | Disposition: A | Discharge status: Home Health | Payer: Medicaid Other | Source: Ambulatory Visit | Attending: Orthopaedic Surgery | Admitting: Orthopaedic Surgery

## 2014-02-01 ENCOUNTER — Encounter (HOSPITAL_COMMUNITY): Payer: Medicaid Other | Admitting: Anesthesiology

## 2014-02-01 ENCOUNTER — Inpatient Hospital Stay (HOSPITAL_COMMUNITY): Payer: Medicaid Other

## 2014-02-01 ENCOUNTER — Encounter (HOSPITAL_COMMUNITY)
Admission: RE | Disposition: A | Discharge status: Home Health | Payer: Self-pay | Source: Ambulatory Visit | Attending: Orthopaedic Surgery

## 2014-02-01 ENCOUNTER — Inpatient Hospital Stay (HOSPITAL_COMMUNITY): Payer: Medicaid Other | Admitting: Anesthesiology

## 2014-02-01 ENCOUNTER — Encounter (HOSPITAL_COMMUNITY): Payer: Self-pay | Admitting: *Deleted

## 2014-02-01 DIAGNOSIS — D62 Acute posthemorrhagic anemia: Secondary | ICD-10-CM | POA: Diagnosis not present

## 2014-02-01 DIAGNOSIS — I10 Essential (primary) hypertension: Secondary | ICD-10-CM | POA: Diagnosis present

## 2014-02-01 DIAGNOSIS — M1710 Unilateral primary osteoarthritis, unspecified knee: Principal | ICD-10-CM | POA: Diagnosis present

## 2014-02-01 DIAGNOSIS — F17200 Nicotine dependence, unspecified, uncomplicated: Secondary | ICD-10-CM | POA: Diagnosis present

## 2014-02-01 DIAGNOSIS — Z96659 Presence of unspecified artificial knee joint: Secondary | ICD-10-CM

## 2014-02-01 DIAGNOSIS — Z79899 Other long term (current) drug therapy: Secondary | ICD-10-CM

## 2014-02-01 DIAGNOSIS — M1712 Unilateral primary osteoarthritis, left knee: Secondary | ICD-10-CM

## 2014-02-01 HISTORY — PX: TOTAL KNEE ARTHROPLASTY: SHX125

## 2014-02-01 LAB — TYPE AND SCREEN
ABO/RH(D): O POS
Antibody Screen: NEGATIVE

## 2014-02-01 SURGERY — ARTHROPLASTY, KNEE, TOTAL
Anesthesia: General | Site: Knee | Laterality: Left

## 2014-02-01 MED ORDER — PROPOFOL 10 MG/ML IV BOLUS
INTRAVENOUS | Status: AC
Start: 2014-02-01 — End: 2014-02-01
  Filled 2014-02-01: qty 20

## 2014-02-01 MED ORDER — PANTOPRAZOLE SODIUM 40 MG PO TBEC
80.0000 mg | DELAYED_RELEASE_TABLET | Freq: Every day | ORAL | Status: DC
  Administered 2014-02-02 – 2014-02-04 (×3): 80 mg via ORAL
  Filled 2014-02-01 (×3): qty 2

## 2014-02-01 MED ORDER — FENTANYL CITRATE 0.05 MG/ML IJ SOLN
INTRAMUSCULAR | Status: AC
  Filled 2014-02-01: qty 2

## 2014-02-01 MED ORDER — ACETAMINOPHEN 650 MG RE SUPP: 650.0000 mg | Freq: Four times a day (QID) | RECTAL | Status: DC | PRN

## 2014-02-01 MED ORDER — KETOROLAC TROMETHAMINE 30 MG/ML IJ SOLN
30.0000 mg | Freq: Once | INTRAMUSCULAR | Status: AC
  Administered 2014-02-01: 30 mg via INTRAVENOUS
  Filled 2014-02-01: qty 1

## 2014-02-01 MED ORDER — MIDAZOLAM HCL 2 MG/2ML IJ SOLN
INTRAMUSCULAR | Status: AC
  Filled 2014-02-01: qty 2

## 2014-02-01 MED ORDER — CEFAZOLIN SODIUM-DEXTROSE 2-3 GM-% IV SOLR
2.0000 g | INTRAVENOUS | Status: AC
  Administered 2014-02-01: 2 g via INTRAVENOUS

## 2014-02-01 MED ORDER — LACTATED RINGERS IV SOLN
INTRAVENOUS | Status: DC | PRN
  Administered 2014-02-01 (×2): via INTRAVENOUS

## 2014-02-01 MED ORDER — MIDAZOLAM HCL 5 MG/5ML IJ SOLN
INTRAMUSCULAR | Status: DC | PRN
  Administered 2014-02-01: 2 mg via INTRAVENOUS

## 2014-02-01 MED ORDER — METHOCARBAMOL 500 MG PO TABS
500.0000 mg | ORAL_TABLET | Freq: Four times a day (QID) | ORAL | Status: DC | PRN
  Administered 2014-02-01 – 2014-02-04 (×8): 500 mg via ORAL
  Filled 2014-02-01 (×9): qty 1

## 2014-02-01 MED ORDER — METOCLOPRAMIDE HCL 10 MG PO TABS: 5.0000 mg | ORAL_TABLET | Freq: Three times a day (TID) | ORAL | Status: DC | PRN

## 2014-02-01 MED ORDER — NICOTINE 21 MG/24HR TD PT24
21.0000 mg | MEDICATED_PATCH | Freq: Every day | TRANSDERMAL | Status: DC
  Administered 2014-02-01 – 2014-02-04 (×4): 21 mg via TRANSDERMAL
  Filled 2014-02-01 (×4): qty 1

## 2014-02-01 MED ORDER — 0.9 % SODIUM CHLORIDE (POUR BTL) OPTIME
TOPICAL | Status: DC | PRN
  Administered 2014-02-01: 1000 mL

## 2014-02-01 MED ORDER — HYDROMORPHONE HCL PF 1 MG/ML IJ SOLN
0.2500 mg | INTRAMUSCULAR | Status: DC | PRN
  Administered 2014-02-01 (×3): 0.25 mg via INTRAVENOUS

## 2014-02-01 MED ORDER — METHOCARBAMOL 1000 MG/10ML IJ SOLN
500.0000 mg | Freq: Four times a day (QID) | INTRAVENOUS | Status: DC | PRN
  Administered 2014-02-01: 500 mg via INTRAVENOUS
  Filled 2014-02-01: qty 5

## 2014-02-01 MED ORDER — DOCUSATE SODIUM 100 MG PO CAPS
100.0000 mg | ORAL_CAPSULE | Freq: Two times a day (BID) | ORAL | Status: DC
  Administered 2014-02-01 – 2014-02-04 (×7): 100 mg via ORAL

## 2014-02-01 MED ORDER — ONDANSETRON HCL 4 MG PO TABS: 4.0000 mg | ORAL_TABLET | Freq: Four times a day (QID) | ORAL | Status: DC | PRN

## 2014-02-01 MED ORDER — CEFAZOLIN SODIUM 1-5 GM-% IV SOLN
1.0000 g | Freq: Four times a day (QID) | INTRAVENOUS | Status: AC
  Administered 2014-02-01 (×2): 1 g via INTRAVENOUS
  Filled 2014-02-01 (×2): qty 50

## 2014-02-01 MED ORDER — LIDOCAINE HCL (CARDIAC) 20 MG/ML IV SOLN
INTRAVENOUS | Status: AC
  Filled 2014-02-01: qty 5

## 2014-02-01 MED ORDER — FENTANYL CITRATE 0.05 MG/ML IJ SOLN
INTRAMUSCULAR | Status: DC | PRN
  Administered 2014-02-01: 100 ug via INTRAVENOUS

## 2014-02-01 MED ORDER — LIDOCAINE HCL (CARDIAC) 20 MG/ML IV SOLN
INTRAVENOUS | Status: DC | PRN
  Administered 2014-02-01: 50 mg via INTRAVENOUS

## 2014-02-01 MED ORDER — PROMETHAZINE HCL 25 MG/ML IJ SOLN: 6.2500 mg | INTRAMUSCULAR | Status: DC | PRN

## 2014-02-01 MED ORDER — PROPOFOL 10 MG/ML IV BOLUS
INTRAVENOUS | Status: AC
  Filled 2014-02-01: qty 20

## 2014-02-01 MED ORDER — METOCLOPRAMIDE HCL 5 MG/ML IJ SOLN
5.0000 mg | Freq: Three times a day (TID) | INTRAMUSCULAR | Status: DC | PRN
Start: 2014-02-01 — End: 2014-02-04

## 2014-02-01 MED ORDER — ONDANSETRON HCL 4 MG/2ML IJ SOLN
INTRAMUSCULAR | Status: DC | PRN
  Administered 2014-02-01: 4 mg via INTRAVENOUS

## 2014-02-01 MED ORDER — DIPHENHYDRAMINE HCL 12.5 MG/5ML PO ELIX: 12.5000 mg | ORAL_SOLUTION | ORAL | Status: DC | PRN

## 2014-02-01 MED ORDER — HYDROMORPHONE HCL PF 1 MG/ML IJ SOLN
1.0000 mg | INTRAMUSCULAR | Status: DC | PRN
  Administered 2014-02-01 – 2014-02-03 (×17): 1 mg via INTRAVENOUS
  Filled 2014-02-01 (×18): qty 1

## 2014-02-01 MED ORDER — LACTATED RINGERS IV SOLN: INTRAVENOUS | Status: DC

## 2014-02-01 MED ORDER — BUPIVACAINE IN DEXTROSE 0.75-8.25 % IT SOLN
INTRATHECAL | Status: DC | PRN
  Administered 2014-02-01: 1.8 mL via INTRATHECAL

## 2014-02-01 MED ORDER — ACETAMINOPHEN 325 MG PO TABS
650.0000 mg | ORAL_TABLET | Freq: Four times a day (QID) | ORAL | Status: DC | PRN
  Administered 2014-02-03: 650 mg via ORAL
  Filled 2014-02-01: qty 2

## 2014-02-01 MED ORDER — ASPIRIN EC 325 MG PO TBEC
325.0000 mg | DELAYED_RELEASE_TABLET | Freq: Two times a day (BID) | ORAL | Status: DC
  Administered 2014-02-01 – 2014-02-04 (×6): 325 mg via ORAL
  Filled 2014-02-01 (×8): qty 1

## 2014-02-01 MED ORDER — POLYETHYLENE GLYCOL 3350 17 G PO PACK
17.0000 g | PACK | Freq: Every day | ORAL | Status: DC | PRN
Start: 2014-02-01 — End: 2014-02-04

## 2014-02-01 MED ORDER — HYDROMORPHONE HCL PF 1 MG/ML IJ SOLN
1.0000 mg | INTRAMUSCULAR | Status: DC | PRN
  Administered 2014-02-01: 1 mg via INTRAVENOUS
  Filled 2014-02-01 (×2): qty 1

## 2014-02-01 MED ORDER — LISINOPRIL 20 MG PO TABS
20.0000 mg | ORAL_TABLET | Freq: Two times a day (BID) | ORAL | Status: DC
  Administered 2014-02-01 – 2014-02-03 (×5): 20 mg via ORAL
  Filled 2014-02-01 (×8): qty 1

## 2014-02-01 MED ORDER — MENTHOL 3 MG MT LOZG
1.0000 | LOZENGE | OROMUCOSAL | Status: DC | PRN
  Filled 2014-02-01: qty 9

## 2014-02-01 MED ORDER — CEFAZOLIN SODIUM-DEXTROSE 2-3 GM-% IV SOLR
INTRAVENOUS | Status: AC
  Filled 2014-02-01: qty 50

## 2014-02-01 MED ORDER — PROPOFOL INFUSION 10 MG/ML OPTIME
INTRAVENOUS | Status: DC | PRN
  Administered 2014-02-01: 100 ug/kg/min via INTRAVENOUS

## 2014-02-01 MED ORDER — SODIUM CHLORIDE 0.9 % IV SOLN
INTRAVENOUS | Status: DC
  Administered 2014-02-01: 75 mL/h via INTRAVENOUS
  Administered 2014-02-02 (×3): via INTRAVENOUS
  Administered 2014-02-03: 75 mL/h via INTRAVENOUS

## 2014-02-01 MED ORDER — ONDANSETRON HCL 4 MG/2ML IJ SOLN: 4.0000 mg | Freq: Four times a day (QID) | INTRAMUSCULAR | Status: DC | PRN

## 2014-02-01 MED ORDER — PHENOL 1.4 % MT LIQD
1.0000 | OROMUCOSAL | Status: DC | PRN
  Filled 2014-02-01: qty 177

## 2014-02-01 MED ORDER — OXYCODONE HCL 5 MG PO TABS
5.0000 mg | ORAL_TABLET | ORAL | Status: DC | PRN
  Administered 2014-02-01 – 2014-02-03 (×16): 15 mg via ORAL
  Administered 2014-02-04: 10 mg via ORAL
  Administered 2014-02-04: 15 mg via ORAL
  Filled 2014-02-01 (×18): qty 3

## 2014-02-01 MED ORDER — OXYCODONE HCL ER 20 MG PO T12A
20.0000 mg | EXTENDED_RELEASE_TABLET | Freq: Two times a day (BID) | ORAL | Status: DC
  Administered 2014-02-01 – 2014-02-04 (×7): 20 mg via ORAL
  Filled 2014-02-01 (×7): qty 1

## 2014-02-01 MED ORDER — ALPRAZOLAM 1 MG PO TABS
1.0000 mg | ORAL_TABLET | Freq: Three times a day (TID) | ORAL | Status: DC
  Administered 2014-02-01 – 2014-02-03 (×8): 1 mg via ORAL
  Filled 2014-02-01 (×8): qty 1

## 2014-02-01 MED ORDER — HYDROMORPHONE HCL PF 1 MG/ML IJ SOLN
INTRAMUSCULAR | Status: AC
  Administered 2014-02-01: 1 mg via INTRAVENOUS
  Filled 2014-02-01: qty 1

## 2014-02-01 MED ORDER — ONDANSETRON HCL 4 MG/2ML IJ SOLN
INTRAMUSCULAR | Status: AC
  Filled 2014-02-01: qty 2

## 2014-02-01 MED ORDER — OXYCODONE HCL 5 MG PO TABS
5.0000 mg | ORAL_TABLET | ORAL | Status: DC | PRN
  Administered 2014-02-01 (×2): 5 mg via ORAL
  Filled 2014-02-01 (×2): qty 1

## 2014-02-01 MED ORDER — SODIUM CHLORIDE 0.9 % IR SOLN
Status: DC | PRN
  Administered 2014-02-01: 1000 mL

## 2014-02-01 MED ORDER — ALUM & MAG HYDROXIDE-SIMETH 200-200-20 MG/5ML PO SUSP: 30.0000 mL | ORAL | Status: DC | PRN

## 2014-02-01 SURGICAL SUPPLY — 53 items
BANDAGE ELASTIC 6 VELCRO ST LF (GAUZE/BANDAGES/DRESSINGS) ×4 IMPLANT
BANDAGE ESMARK 6X9 LF (GAUZE/BANDAGES/DRESSINGS) ×1 IMPLANT
BLADE SAG 13.0X1.37X90 (BLADE) ×3 IMPLANT
BLADE SAG 18X100X1.27 (BLADE) ×3 IMPLANT
BNDG CMPR 9X6 STRL LF SNTH (GAUZE/BANDAGES/DRESSINGS) ×1
BNDG ESMARK 6X9 LF (GAUZE/BANDAGES/DRESSINGS) ×3
BOWL SMART MIX CTS (DISPOSABLE) ×3 IMPLANT
CEMENT BONE 1-PACK (Cement) ×6 IMPLANT
CUFF TOURN SGL QUICK 34 (TOURNIQUET CUFF) ×3
CUFF TRNQT CYL 34X4X40X1 (TOURNIQUET CUFF) ×1 IMPLANT
DRAPE EXTREMITY T 121X128X90 (DRAPE) ×3 IMPLANT
DRAPE LG THREE QUARTER DISP (DRAPES) ×3 IMPLANT
DRAPE POUCH INSTRU U-SHP 10X18 (DRAPES) ×3 IMPLANT
DRAPE U-SHAPE 47X51 STRL (DRAPES) ×3 IMPLANT
DURAPREP 26ML APPLICATOR (WOUND CARE) ×3 IMPLANT
ELECT REM PT RETURN 9FT ADLT (ELECTROSURGICAL) ×3
ELECTRODE REM PT RTRN 9FT ADLT (ELECTROSURGICAL) ×1 IMPLANT
EVACUATOR 1/8 PVC DRAIN (DRAIN) ×3 IMPLANT
FACESHIELD WRAPAROUND (MASK) ×12 IMPLANT
FACESHIELD WRAPAROUND OR TEAM (MASK) ×5 IMPLANT
GAUZE XEROFORM 1X8 LF (GAUZE/BANDAGES/DRESSINGS) ×2 IMPLANT
GLOVE BIO SURGEON STRL SZ7.5 (GLOVE) ×3 IMPLANT
GLOVE BIOGEL PI IND STRL 8 (GLOVE) ×2 IMPLANT
GLOVE BIOGEL PI INDICATOR 8 (GLOVE) ×4
GLOVE ECLIPSE 8.0 STRL XLNG CF (GLOVE) ×3 IMPLANT
GOWN STRL REUS W/TWL XL LVL3 (GOWN DISPOSABLE) ×6 IMPLANT
HANDPIECE INTERPULSE COAX TIP (DISPOSABLE) ×3
IMMOBILIZER KNEE 20 (SOFTGOODS) ×3 IMPLANT
KIT BASIN OR (CUSTOM PROCEDURE TRAY) ×3 IMPLANT
KNEE/VIT E POLY LINER LEVEL 1B ×2 IMPLANT
NDL HYPO 21X1.5 SAFETY (NEEDLE) IMPLANT
NEEDLE HYPO 21X1.5 SAFETY (NEEDLE) ×3 IMPLANT
NS IRRIG 1000ML POUR BTL (IV SOLUTION) ×3 IMPLANT
PACK TOTAL JOINT (CUSTOM PROCEDURE TRAY) ×3 IMPLANT
PAD ABD 8X10 STRL (GAUZE/BANDAGES/DRESSINGS) ×4 IMPLANT
PADDING CAST COTTON 6X4 STRL (CAST SUPPLIES) ×4 IMPLANT
POSITIONER SURGICAL ARM (MISCELLANEOUS) ×3 IMPLANT
SET HNDPC FAN SPRY TIP SCT (DISPOSABLE) ×1 IMPLANT
SET PAD KNEE POSITIONER (MISCELLANEOUS) ×3 IMPLANT
SPONGE GAUZE 4X4 12PLY (GAUZE/BANDAGES/DRESSINGS) ×2 IMPLANT
STAPLER VISISTAT 35W (STAPLE) ×2 IMPLANT
SUCTION FRAZIER 12FR DISP (SUCTIONS) ×3 IMPLANT
SUT VIC AB 0 CT1 27 (SUTURE) ×6
SUT VIC AB 0 CT1 27XBRD ANTBC (SUTURE) IMPLANT
SUT VIC AB 1 CT1 27 (SUTURE) ×6
SUT VIC AB 1 CT1 27XBRD ANTBC (SUTURE) ×3 IMPLANT
SUT VIC AB 2-0 CT1 27 (SUTURE) ×6
SUT VIC AB 2-0 CT1 TAPERPNT 27 (SUTURE) ×2 IMPLANT
SYR 50ML LL SCALE MARK (SYRINGE) ×2 IMPLANT
TOWEL OR 17X26 10 PK STRL BLUE (TOWEL DISPOSABLE) ×3 IMPLANT
TRAY FOLEY CATH 16FRSI W/METER (SET/KITS/TRAYS/PACK) ×2 IMPLANT
WATER STERILE IRR 1500ML POUR (IV SOLUTION) ×3 IMPLANT
WRAP KNEE MAXI GEL POST OP (GAUZE/BANDAGES/DRESSINGS) ×3 IMPLANT

## 2014-02-01 NOTE — Anesthesia Postprocedure Evaluation (Signed)
  Anesthesia Post-op Note  Patient: Charles [name]  Procedure(s) Performed: Procedure(s) (LRB): LEFT TOTAL KNEE ARTHROPLASTY (Left)  Patient Location: PACU  Anesthesia Type: Spinal  Level of Consciousness: awake and alert   Airway and Oxygen Therapy: Patient Spontanous Breathing  Post-op Pain: mild  Post-op Assessment: Post-op Vital signs reviewed, Patient's Cardiovascular Status Stable, Respiratory Function Stable, Patent Airway and No signs of Nausea or vomiting  Last Vitals:  Filed Vitals:   02/01/14 0610  BP: 93/56  Pulse: 84  Temp: 36.4 C  Resp: 18    Post-op Vital Signs: stable   Complications: No apparent anesthesia complications

## 2014-02-01 NOTE — Anesthesia Procedure Notes (Signed)
Spinal  Patient location during procedure: OR End time: 02/01/2014 7:31 AM Staffing CRNA/Resident: Noralyn Pick Performed by: anesthesiologist and resident/CRNA  Preanesthetic Checklist Completed: patient identified, site marked, surgical consent, pre-op evaluation, timeout performed, IV checked, risks and benefits discussed and monitors and equipment checked Spinal Block Patient position: sitting Prep: Betadine Patient monitoring: heart rate, continuous pulse ox and blood pressure Approach: midline Location: L3-4 Injection technique: single-shot Needle Needle type: Sprotte and Pencil-Tip  Needle gauge: 24 G Needle length: 9 cm Assessment Sensory level: T6 Additional Notes Expiration date of kit checked and confirmed. Patient tolerated procedure well, without complications.

## 2014-02-01 NOTE — Progress Notes (Signed)
PT Cancellation Note  Patient Details Name: Charles [name] MRN: [id number] DOB: [birth year]   Cancelled Treatment:     POD 0 Eval deferred 2* pt's elevated pain level.  Will follow in am.   Mathis Fare 02/01/2014, 3:34 PM

## 2014-02-01 NOTE — Transfer of Care (Signed)
Immediate Anesthesia Transfer of Care Note  Patient: Charles [name]  Procedure(s) Performed: Procedure(s): LEFT TOTAL KNEE ARTHROPLASTY (Left)  Patient Location: PACU  Anesthesia Type:Regional  Level of Consciousness: awake, alert  and oriented  Airway & Oxygen Therapy: Patient Spontanous Breathing and Patient connected to face mask oxygen  Post-op Assessment: Report given to PACU RN and Post -op Vital signs reviewed and stable  Post vital signs: Reviewed and stable  Complications: No apparent anesthesia complications

## 2014-02-01 NOTE — Brief Op Note (Signed)
02/01/2014  9:21 AM  PATIENT:  Charles [name]  48 y.o. male  PRE-OPERATIVE DIAGNOSIS:  Left knee osteoarthritis  POST-OPERATIVE DIAGNOSIS:  Left knee osteoarthritis  PROCEDURE:  Procedure(s): LEFT TOTAL KNEE ARTHROPLASTY (Left)  SURGEON:  Surgeon(s) and Role:    * Mcarthur Rossetti, MD - Primary  PHYSICIAN ASSISTANT: Benita Stabile, PA-C  ANESTHESIA:   spinal  EBL:  Total I/O In: 1000 [I.V.:1000] Out: 400 [Urine:250; Blood:150]  BLOOD ADMINISTERED:none  DRAINS: (medium) Hemovact drain(s) in the knee with  Suction Open   LOCAL MEDICATIONS USED:  NONE  SPECIMEN:  No Specimen  DISPOSITION OF SPECIMEN:  N/A  COUNTS:  YES  TOURNIQUET:   Total Tourniquet Time Documented: Thigh (Left) - 65 minutes Total: Thigh (Left) - 65 minutes   DICTATION: .Viviann Spare Dictation and Other Dictation: Dictation Number [id number]  PLAN OF CARE: Admit to inpatient   PATIENT DISPOSITION:  PACU - hemodynamically stable.   Delay start of Pharmacological VTE agent (>24hrs) due to surgical blood loss or risk of bleeding: no

## 2014-02-01 NOTE — Care Management Note (Signed)
    Page 1 of 1   02/01/2014     4:23:58 PM CARE MANAGEMENT NOTE 02/01/2014  Patient:  Charles [name], Charles [name]   Account Number:  [id number]  Date Initiated:  02/01/2014  Documentation initiated by:  Sunday Spillers  Subjective/Objective Assessment:   48 yo male admitted s/p left TKA. PTA lived at home with father.     Action/Plan:   Home when stable   Anticipated DC Date:  02/03/2014   Anticipated DC Plan:  [place]  CM consult      PAC Choice  [place]   Choice offered to / List presented to:        DME agency  [place] arranged  [place]   Status of service:  In process, will continue to follow Medicare Important Message given?   (If response is "NO", the following Medicare IM given date fields will be blank) Date Medicare IM given:   Date Additional Medicare IM given:    Discharge Disposition:  [place]  Per UR Regulation:  Reviewed for med. necessity/level of care/duration of stay  If discussed at [place] of Stay Meetings, dates discussed:    Comments:

## 2014-02-01 NOTE — Anesthesia Preprocedure Evaluation (Signed)
Anesthesia Evaluation  Patient identified by MRN, date of birth, ID band Patient awake    Reviewed: Allergy & Precautions, H&P , NPO status , Patient's Chart, lab work & pertinent test results  Airway Mallampati: II TM Distance: >3 FB Neck ROM: Full    Dental no notable dental hx.    Pulmonary neg pulmonary ROS, Current Smoker,  breath sounds clear to auscultation  Pulmonary exam normal       Cardiovascular hypertension, Pt. on medications Rhythm:Regular Rate:Normal     Neuro/Psych negative neurological ROS  negative psych ROS   GI/Hepatic negative GI ROS, (+)     substance abuse  , Hepatitis -, CMethadone dependence   Endo/Other  negative endocrine ROS  Renal/GU negative Renal ROS  negative genitourinary   Musculoskeletal negative musculoskeletal ROS (+)   Abdominal   Peds negative pediatric ROS (+)  Hematology negative hematology ROS (+)   Anesthesia Other Findings   Reproductive/Obstetrics negative OB ROS                           Anesthesia Physical Anesthesia Plan  ASA: III  Anesthesia Plan: General   Post-op Pain Management:    Induction: Intravenous  Airway Management Planned: LMA and Oral ETT  Additional Equipment:   Intra-op Plan:   Post-operative Plan: Extubation in OR  Informed Consent: I have reviewed the patients History and Physical, chart, labs and discussed the procedure including the risks, benefits and alternatives for the proposed anesthesia with the patient or authorized representative who has indicated his/her understanding and acceptance.   Dental advisory given  Plan Discussed with: CRNA and Surgeon  Anesthesia Plan Comments:         Anesthesia Quick Evaluation
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
## 2014-02-02 LAB — CBC
HCT: 30 % — ABNORMAL LOW (ref 39.0–52.0)
Hemoglobin: 10.5 g/dL — ABNORMAL LOW (ref 13.0–17.0)
MCH: 32 pg (ref 26.0–34.0)
MCHC: 35 g/dL (ref 30.0–36.0)
MCV: 91.5 fL (ref 78.0–100.0)
Platelets: 125 10*3/uL — ABNORMAL LOW (ref 150–400)
RBC: 3.28 MIL/uL — ABNORMAL LOW (ref 4.22–5.81)
RDW: 12 % (ref 11.5–15.5)
WBC: 6.5 10*3/uL (ref 4.0–10.5)

## 2014-02-02 LAB — BASIC METABOLIC PANEL
BUN: 15 mg/dL (ref 6–23)
CALCIUM: 8.2 mg/dL — AB (ref 8.4–10.5)
CO2: 27 meq/L (ref 19–32)
CREATININE: 0.91 mg/dL (ref 0.50–1.35)
Chloride: 97 mEq/L (ref 96–112)
GFR calc Af Amer: >90 mL/min (ref >90)
GFR calc non Af Amer: >90 mL/min (ref >90)
GLUCOSE: 132 mg/dL — AB (ref 70–99)
Potassium: 3.9 mEq/L (ref 3.7–5.3)
Sodium: 134 mEq/L — ABNORMAL LOW (ref 137–147)

## 2014-02-02 NOTE — Op Note (Signed)
NAMEARSHAD, OBERHOLZER NO.:  [id number]  MEDICAL RECORD NO.:  [id number]  LOCATION:  [facility]                         FACILITY:  [facility]  PHYSICIAN:  Lind Guest. Ninfa Linden, M.D.DATE OF BIRTH:  Sep 21, 1966  DATE OF PROCEDURE:  02/01/2014 DATE OF DISCHARGE:                              OPERATIVE REPORT   POSTOPERATIVE DIAGNOSIS:  Severe arthritis and degenerative joint disease, left knee.  POSTOPERATIVE DIAGNOSIS:  Severe arthritis and degenerative joint disease, left knee.  PROCEDURE:  Left total knee arthroplasty.  IMPLANTS:  Stryker triathlon knee with size 5 femur, size 6 tibial tray, 13-mm polyethylene insert, size 35 patellar button.  SURGEON:  Lind Guest. Ninfa Linden, M.D.  ASSISTANT:  Erskine Emery, P.A.  ANESTHESIA:  Spinal.  ESTIMATED BLOOD LOSS:  Less than 100 mL.  ANTIBIOTICS:  2 g IV Ancef.  COMPLICATIONS:  None.  INDICATIONS:  Mr. Coll is a 48 year old gentleman who I have seen him for many years now.  He has a varus deformity of both his knees with loss of a joint space on the medial side, but also patellofemoral symptoms.  It was felt that at this point given the failure of conservative treatment which includes rest, ice, heat, weight loss, use of assistive devices, multiple injections, anti-inflammatories, and physical therapy that the next step would be to proceed with total knee arthroplasty although the radiographic evidence shows that his right knee is much worse, in terms of radiographs, his left knee is more painful knee, he has had recurrent effusion in both knees.  He understands the risks of acute blood loss anemia, nerve and vessel injury, fracture, infection, and DVT.  He understands the goals are to decrease pain, improve mobility, and overall improve quality of life.  DESCRIPTION OF PROCEDURE:  After informed consent was obtained, appropriate left knee was marked.  He was brought to the operating room and spinal  anesthesia was obtained and a Foley catheter was placed and a nonsterile tourniquet was placed on his upper left thigh.  His left leg was then prepped and draped with DuraPrep and sterile drapes.  A time- out was called and he was identified as correct patient and correct left leg.  We then used an Esmarch to wrap out the leg and tourniquet was inflated to 300 mmHg of pressure.  I then made an incision directly over the patella and carried this proximally and distally and dissected down the knee joint.  We performed a medial parapatellar arthrotomy and found a large joint effusion.  When we flexed the knee, we found that there was a large area of cartilage loss that was full thickness with very hard bone on the medial femoral condyle and the medial tibial plateau with varus deformity of the knee.  We removed remnants of ACL, PCL, medial and lateral meniscus, and released tissue around the medial aspect of his knee due to tightness.  With the knee flexed, then we used external medullary cutting guide for the tibia.  We set our cut for taking 9 mm off the high side and placing a 0 slope and correcting for varus valgus.  We then made our tibial cut.  Attention was then turned to the femur.  Using  intramedullary guide for the femur, after making a drill hole in the intercondylar area, we used a distal cutting block for set at 5 degrees left and made our distal femoral cut at 10 mm.  We then brought the knee back down in extension and with just even a 9-mm extension block, he showed a little bit of hyperextension, so we replaced this and we removed all the pins.  We then went back to the femur and we put a femoral sizing guide based off the epicondylar axis into the Whitesides line, and set for a size 5 femur.  We put the 4-in-1 cutting block for the left size 5 femur and made our anterior and posterior cuts followed by our chamfer cuts.  We then made our femoral box cut.  Attention went back  to the tibia based off the tibial tubercle.  We sized for a size 6 tibia.  We put the cutting block on the size 6 tibia for our post and keel.  Then, with the trial size tibia, 6 tibia and placing the trial size 5 femur, we trialed a 13-mm polyethylene insert, and I was pleased with the stability of this gave him to his knee with good range of motion.  We then made our patellar cut and drilled holes for a size 35 patellar button.  With all trial components in place, I was pleased with his range of motion and stability.  I then removed all trial components.  We copiously irrigated the knee with normal saline solution using pulsatile lavage.  We then mixed the cement and cemented the real Stryker triathlon tibia size 6, the real size 5 femur.  We placed the real 13-mm fix bearing polyethylene insert and cemented the 35 patella button.  Once the cement had dried, we let the tourniquet down.  Hemostasis was obtained with electrocautery.  We placed a medium Hemovac in the arthrotomy and closed the arthrotomy with interrupted #1 Vicryl suture followed by 0 Vicryl in the deep tissue, 2-0 Vicryl in the subcutaneous tissue, interrupted staples on the skin.  Xeroform and well-padded sterile dressing was applied.  He was taken off the operating table into the recovery room in stable condition.  All final counts were correct and no complications were noted.     Lind Guest. Ninfa Linden, M.D.     CYB/MEDQ  D:  02/01/2014  T:  02/02/2014  Job:  [id number]  cc:   Dr. Fredna Dow

## 2014-02-02 NOTE — Evaluation (Signed)
Occupational Therapy Evaluation Patient Details Name: Charles [name] MRN: [id number] DOB: 1966-07-30 Today's Date: 02/02/2014    History of Present Illness s/p L TKA.     Clinical Impression   Pt presents with increased pain in L knee, despite maximum pain medication having been provided, limiting his willingness to participate in evaluation.  Began instruction in use of AE for LB ADL, options for tub equipment as pt would like to shower as soon as MD approves, and use of 3 in 1. Pt lives with his father who is in good health, but prefers to not rely on him for personal care. Performed seated grooming.  Will follow.     Follow Up Recommendations  Home health OT (depending on progress)    Equipment Recommendations  3 in 1 bedside comode;Tub/shower bench    Recommendations for Other Services       Precautions / Restrictions Precautions Precautions: Knee;Fall Restrictions Weight Bearing Restrictions: No      Mobility Bed Mobility Overal bed mobility:  (not assessed, pt up in chair)                Transfers Overall transfer level: Needs assistance Equipment used: Rolling walker (2 wheeled) Transfers: Sit to/from Stand Sit to Stand: Mod assist (from chair)         General transfer comment: stood only from chair for assessment, VCs for hand placement and placement of L LE, heavily reliant on UEs    Balance                                            ADL Overall ADL's : Needs assistance/impaired Eating/Feeding: Independent;Sitting   Grooming: Wash/dry hands;Wash/dry face;Sitting;Set up   Upper Body Bathing: Set up;Sitting   Lower Body Bathing: Maximal assistance;Sitting/lateral leans   Upper Body Dressing : Set up;Sitting   Lower Body Dressing: Maximal assistance;Sit to/from stand               Functional mobility during ADLs:  (performed to sit to stand from chair with mod assist) General ADL Comments: Showed pt AE, would like  instruction.  May need indigent kit as he cannot afford items not covered by Medicaid.  Would prefer to have a tub transfer bench so he could shower immediately when MD approves rather than awaiting ability to step over the edge of the tub.     Vision                     Perception     Praxis      Pertinent Vitals/Pain 10/10 L knee, premedicated, iced, repositioned, HR low 100s, 100% 02 on RA     Hand Dominance Right   Extremity/Trunk Assessment Upper Extremity Assessment Upper Extremity Assessment: Overall WFL for tasks assessed   Lower Extremity Assessment Lower Extremity Assessment: Defer to PT evaluation       Communication Communication Communication: No difficulties   Cognition Arousal/Alertness: Lethargic Behavior During Therapy: Flat affect Overall Cognitive Status: Within Functional Limits for tasks assessed                     General Comments       Exercises       Shoulder Instructions      Home Living Family/patient expects to be discharged to:: Private residence Living Arrangements: Parent (father) Available Help at Discharge:  Family;Available 24 hours/day Type of Home: House Home Access: Stairs to enter [employer] of Steps: 3 Entrance Stairs-Rails: Left Home Layout: One level     Bathroom Shower/Tub: Teacher, early years/pre: Standard     Home Equipment: Cane - single point          Prior Functioning/Environment Level of Independence: Independent        Comments: does not drive, disability for back issues    OT Diagnosis: Generalized weakness;Acute pain   OT Problem List: Decreased strength;Impaired balance (sitting and/or standing);Decreased activity tolerance;Decreased knowledge of use of DME or AE;Pain   OT Treatment/Interventions: Self-care/ADL training;DME and/or AE instruction;Therapeutic activities;Patient/family education    OT Goals(Current goals can be found in the care plan  section) Acute Rehab OT Goals Patient Stated Goal: do as much for himself as possible, pain relief OT Goal Formulation: With patient Time For Goal Achievement: 02/09/14 Potential to Achieve Goals: Good ADL Goals Pt Will Perform Grooming: with supervision;standing Pt Will Perform Lower Body Bathing: with supervision;with adaptive equipment;sit to/from stand Pt Will Perform Lower Body Dressing: with supervision;with adaptive equipment;sit to/from stand Pt Will Transfer to Toilet: with supervision;ambulating;bedside commode (OVER TOILET) Pt Will Perform Toileting - Clothing Manipulation and hygiene: with supervision;sit to/from stand Pt Will Perform Tub/Shower Transfer: Tub transfer;with supervision;tub bench;ambulating (determine optimal tub equipment)  OT Frequency: Min 2X/week   Barriers to D/C:            Co-evaluation              End of Session CPM Left Knee CPM Left Knee: Off Nurse Communication: Patient requests pain meds (pt has had all ordered pain medication)  Activity Tolerance: Patient limited by pain;Patient limited by lethargy Patient left: in chair;with call bell/phone within reach   Time: 1012-1034 OT Time Calculation (min): 22 min Charges:  OT General Charges $OT Visit: 1 Procedure OT Evaluation $Initial OT Evaluation Tier I: 1 Procedure OT Treatments $Self Care/Home Management : 8-22 mins G-Codes:    Haze Boyden Maghan [place] 2014/03/02, 10:49 AM [phone]

## 2014-02-02 NOTE — Progress Notes (Signed)
Physical Therapy Treatment Patient Details Name: Charles [name] MRN: [id number] DOB: 07/23/1966 Today's Date: 02/27/2014    History of Present Illness s/p L TKA.      PT Comments      Follow Up Recommendations  Home health PT     Equipment Recommendations  Rolling walker with 5" wheels    Recommendations for Other Services OT consult     Precautions / Restrictions Precautions Precautions: Knee;Fall Required Braces or Orthoses: Knee Immobilizer - Left Knee Immobilizer - Left: Discontinue once straight leg raise with < 10 degree lag Restrictions Weight Bearing Restrictions: No Other Position/Activity Restrictions: WBAT    Mobility  Bed Mobility Overal bed mobility: Needs Assistance Bed Mobility: Sit to Supine       Sit to supine: Min assist   General bed mobility comments: cues for sequencing and use of R LE to self assist  Transfers Overall transfer level: Needs assistance Equipment used: Rolling walker (2 wheeled) Transfers: Sit to/from Stand Sit to Stand: Mod assist         General transfer comment: cues for LE management and use of UEs to self assist  Ambulation/Gait Ambulation/Gait assistance: Min assist Ambulation Distance (Feet): 70 Feet Assistive device: Rolling walker (2 wheeled) Gait Pattern/deviations: Step-to pattern;Decreased step length - right;Decreased step length - left;Shuffle;Trunk flexed Gait velocity: decr   General Gait Details: cues for sequence, posture, stride length and position from [employer]            Wheelchair Mobility    Modified Rankin (Stroke Patients Only)       Balance                                    Cognition Arousal/Alertness: Awake/alert Behavior During Therapy: Flat affect Overall Cognitive Status: Within Functional Limits for tasks assessed                      Exercises      General Comments        Pertinent Vitals/Pain     Home Living                       Prior Function            PT Goals (current goals can now be found in the care plan section) Acute Rehab PT Goals Patient Stated Goal: do as much for himself as possible, pain relief PT Goal Formulation: With patient Time For Goal Achievement: 02/09/14 Potential to Achieve Goals: Good Progress towards PT goals: Progressing toward goals    Frequency  7X/week    PT Plan Current plan remains appropriate    Co-evaluation             End of Session Equipment Utilized During Treatment: Gait belt;Left knee immobilizer Activity Tolerance: Patient tolerated treatment well Patient left: in bed;with call bell/phone within reach     Time: 1525-1540 PT Time Calculation (min): 15 min  Charges:  $Gait Training: 8-22 mins                    G Codes:      Mathis Fare 2014/02/27, 4:20 PM

## 2014-02-02 NOTE — Evaluation (Signed)
Physical Therapy Evaluation Patient Details Name: Charles [name] MRN: [id number] DOB: 09/28/1965 Today's Date: 02/02/2014   History of Present Illness  s/p L TKA.    Clinical Impression  Pt s/p L TKR presents with decreased L LE strength/ROM and post op pain limiting functional mobility.  Pt should progress to d/c home with family assist and HHPT follow up.    Follow Up Recommendations Home health PT    Equipment Recommendations  Rolling walker with 5" wheels    Recommendations for Other Services OT consult     Precautions / Restrictions Precautions Precautions: Knee;Fall Required Braces or Orthoses: Knee Immobilizer - Left Knee Immobilizer - Left: Discontinue once straight leg raise with < 10 degree lag Restrictions Weight Bearing Restrictions: No Other Position/Activity Restrictions: WBAT      Mobility  Bed Mobility Overal bed mobility: Needs Assistance Bed Mobility: Supine to Sit     Supine to sit: Min assist     General bed mobility comments: cues for sequencing and use of R LE to self assist  Transfers Overall transfer level: Needs assistance Equipment used: Rolling walker (2 wheeled) Transfers: Sit to/from Stand Sit to Stand: Mod assist         General transfer comment: cues for LE management and use of UEs to self assist  Ambulation/Gait Ambulation/Gait assistance: Min assist;Mod assist Ambulation Distance (Feet): 35 Feet Assistive device: Rolling walker (2 wheeled) Gait Pattern/deviations: Step-to pattern;Decreased step length - right;Decreased step length - left;Shuffle;Trunk flexed Gait velocity: decr   General Gait Details: cues for sequence, posture, stride length and position from [employer]            Wheelchair Mobility    Modified Rankin (Stroke Patients Only)       Balance                                             Pertinent Vitals/Pain 8/10; premed, cold packs provided, pt requesting additional  pain meds - RN aware    Home Living Family/patient expects to be discharged to:: Private residence Living Arrangements: Parent Available Help at Discharge: Family;Available 24 hours/day Type of Home: House Home Access: Stairs to enter Entrance Stairs-Rails: Left Entrance Stairs-Number of Steps: 3 Home Layout: One level Home Equipment: Cane - single point      Prior Function Level of Independence: Independent         Comments: does not drive, disability for back issues     Hand Dominance   Dominant Hand: Right    Extremity/Trunk Assessment   Upper Extremity Assessment: Overall WFL for tasks assessed           Lower Extremity Assessment: LLE deficits/detail   LLE Deficits / Details: 2/5 quads with AAROM at knee -10 - 40  Cervical / Trunk Assessment: Normal  Communication   Communication: No difficulties  Cognition Arousal/Alertness: Awake/alert Behavior During Therapy: Flat affect Overall Cognitive Status: Within Functional Limits for tasks assessed                      General Comments      Exercises Total Joint Exercises Ankle Circles/Pumps: AROM;Both;10 reps;Supine Quad Sets: AROM;Both;10 reps;Supine Heel Slides: AAROM;Left;10 reps;Supine Straight Leg Raises: AAROM;Left;10 reps;Supine      Assessment/Plan    PT Assessment Patient needs continued PT services  PT Diagnosis Difficulty walking   PT  Problem List Decreased strength;Decreased range of motion;Decreased activity tolerance;Decreased mobility;Decreased knowledge of use of DME;Pain  PT Treatment Interventions DME instruction;Gait training;Stair training;Functional mobility training;Therapeutic activities;Therapeutic exercise;Patient/family education   PT Goals (Current goals can be found in the Care Plan section) Acute Rehab PT Goals Patient Stated Goal: do as much for himself as possible, pain relief PT Goal Formulation: With patient Time For Goal Achievement: 02/09/14 Potential to  Achieve Goals: Good    Frequency 7X/week   Barriers to discharge        Co-evaluation               End of Session Equipment Utilized During Treatment: Gait belt;Left knee immobilizer Activity Tolerance: Patient tolerated treatment well;Patient limited by pain Patient left: in chair;with call bell/phone within reach Nurse Communication: Mobility status         Time: 4536-4680 PT Time Calculation (min): 29 min   Charges:   PT Evaluation $Initial PT Evaluation Tier I: 1 Procedure PT Treatments $Gait Training: 8-22 mins $Therapeutic Exercise: 8-22 mins   PT G Codes:          Mathis Fare 02/02/2014, 12:46 PM

## 2014-02-02 NOTE — Progress Notes (Signed)
   Subjective:  Patient reports pain as marked.  No events  Objective:   VITALS:   Filed Vitals:   02/02/14 0420 02/02/14 0534 02/02/14 0937 02/02/14 0952  BP:  152/92 148/83 145/93  Pulse:  99  99  Temp:  98.4 F (36.9 C)  99.6 F (37.6 C)  TempSrc:  Axillary  Oral  Resp: 16 16  18   Height:      Weight:      SpO2:  100%  99%    Neurologically intact Neurovascular intact Sensation intact distally Intact pulses distally Dorsiflexion/Plantar flexion intact Incision: dressing C/D/I and no drainage No cellulitis present Compartment soft   Lab Results  Component Value Date   WBC 6.5 02/02/2014   HGB 10.5* 02/02/2014   HCT 30.0* 02/02/2014   MCV 91.5 02/02/2014   PLT 125* 02/02/2014     Assessment/Plan:  1 Day Post-Op   - Expected postop acute blood loss anemia - will monitor for symptoms - Up with PT/OT - DVT ppx - SCDs, ambulation, asa - WBAT left and lower extremity - Pain control - Discharge planning - HVAC pulled  Problem List Items Addressed This Visit   None       Harm Jou Eduard Roux 02/02/2014, 1:00 PM [phone]

## 2014-02-03 LAB — CBC
HCT: 27.1 % — ABNORMAL LOW (ref 39.0–52.0)
Hemoglobin: 9.2 g/dL — ABNORMAL LOW (ref 13.0–17.0)
MCH: 31.3 pg (ref 26.0–34.0)
MCHC: 33.9 g/dL (ref 30.0–36.0)
MCV: 92.2 fL (ref 78.0–100.0)
PLATELETS: 123 10*3/uL — AB (ref 150–400)
RBC: 2.94 MIL/uL — ABNORMAL LOW (ref 4.22–5.81)
RDW: 12.2 % (ref 11.5–15.5)
WBC: 7.9 10*3/uL (ref 4.0–10.5)

## 2014-02-03 MED ORDER — ASPIRIN 325 MG PO TBEC: 325.0000 mg | DELAYED_RELEASE_TABLET | Freq: Two times a day (BID) | ORAL | Status: DC

## 2014-02-03 MED ORDER — METHOCARBAMOL 500 MG PO TABS: 500.0000 mg | ORAL_TABLET | Freq: Four times a day (QID) | ORAL | Status: DC | PRN

## 2014-02-03 MED ORDER — OXYCODONE-ACETAMINOPHEN 5-325 MG PO TABS: 1.0000 | ORAL_TABLET | ORAL | Status: DC | PRN

## 2014-02-03 NOTE — Progress Notes (Signed)
Occupational Therapy Treatment Patient Details Name: Charles [name] MRN: [id number] DOB: [birth year] Today's Date: 02/03/2014    History of present illness s/p L TKA.     OT comments  Pt is making slow progress in OT.  He is limited by pain.  Issued reacher, long sponge, long shoehorn and leg lifter today.  Would benefit from further education on these as well as bathroom transfers.    Follow Up Recommendations  Home health OT    Equipment Recommendations  3 in 1 bedside comode;Tub/shower bench    Recommendations for Other Services      Precautions / Restrictions Precautions Precautions: Knee;Fall Required Braces or Orthoses: Knee Immobilizer - Left Knee Immobilizer - Left: Discontinue once straight leg raise with < 10 degree lag Restrictions Weight Bearing Restrictions: Yes LLE Weight Bearing: Weight bearing as tolerated       Mobility Bed Mobility         Supine to sit: Min assist (pt 90%)     General bed mobility comments: used leg lifter, HOB raised  Transfers       Sit to Stand: Min assist;From elevated surface         General transfer comment: cues for UE/LE placement    Balance                                   ADL Overall ADL's : Needs assistance/impaired             Lower Body Bathing: Minimal assistance;Sit to/from stand;With adaptive equipment                       Functional mobility during ADLs: Minimal assistance;Rolling walker General ADL Comments: issued AE kit minus sock aid.  Educated on [place]. long sponge.  Educated to cross RLE under L ankle to clear foot with adls--simulated only.  Brought in tub bench and demonstrated this.  Pt had too much pain to attempt today.  Issued leg lifter, and pt used to get OOB with min A and cues for technique.  Transferred into recliner      Vision                     Perception     Praxis      Cognition   Behavior During Therapy: Flat affect Overall  Cognitive Status: Within Functional Limits for tasks assessed                       Extremity/Trunk Assessment               Exercises     Shoulder Instructions       General Comments      Pertinent Vitals/ Pain       8/10 L knee; repositioned with ice.  Pt was premedicated--he requested more pain medication  Home Living                                          Prior Functioning/Environment              Frequency Min 2X/week     Progress Toward Goals  OT Goals(current goals can now be found in the care plan section)  Progress towards OT goals: Progressing toward goals  Plan      Co-evaluation                 End of Session     Activity Tolerance Patient limited by pain   Patient Left in chair;with call bell/phone within reach   Nurse Communication Patient requests pain meds        Time: 0752-0828 OT Time Calculation (min): 36 min  Charges: OT General Charges $OT Visit: 1 Procedure OT Treatments $Self Care/Home Management : 23-37 mins  [facility] 02/03/2014, 8:39 AM  Lesle Chris, OTR/L [phone] 02/03/2014

## 2014-02-03 NOTE — Progress Notes (Signed)
Physical Therapy Treatment Patient Details Name: Charles [name] MRN: [id number] DOB: 03/13/1966 Today's Date: 02/03/2014    History of Present Illness s/p L TKA.      PT Comments    POD # 2 pm session.  Assisted pt out of recliner to amb second time in hallway.  Progressing slowly due to impaired cognition (meds?).  Assisted back to bed for CPM>  Follow Up Recommendations  Home health PT;Supervision/Assistance - 24 hour     Equipment Recommendations  Rolling walker with 5" wheels    Recommendations for Other Services       Precautions / Restrictions Precautions Precautions: Knee;Fall Precaution Comments: Instructed pt on KI use for amb/stairs Required Braces or Orthoses: Knee Immobilizer - Left Knee Immobilizer - Left: Discontinue once straight leg raise with < 10 degree lag Restrictions Weight Bearing Restrictions: No LLE Weight Bearing: Weight bearing as tolerated    Mobility  Bed Mobility Overal bed mobility: Needs Assistance Bed Mobility: Sit to Supine     Supine to sit: Min assist     General bed mobility comments: Assisted pt back to bed for CPM  Transfers Overall transfer level: Needs assistance Equipment used: Rolling walker (2 wheeled) Transfers: Sit to/from Stand Sit to Stand: Min assist         General transfer comment: 50% VC's on proper tech and hand placement  Ambulation/Gait Ambulation/Gait assistance: Min assist Ambulation Distance (Feet): 85 Feet Assistive device: Rolling walker (2 wheeled) Gait Pattern/deviations: Step-to pattern;Trunk flexed;Decreased stance time - left Gait velocity: decr   General Gait Details: 75% VC's on proper gait sequencing and proper walker to self distance.  Also required increased time and cueing for safety to negociate around obsticles.    Stairs            Wheelchair Mobility    Modified Rankin (Stroke Patients Only)       Balance                                     Cognition                            Exercises      General Comments        Pertinent Vitals/Pain     Home Living                      Prior Function            PT Goals (current goals can now be found in the care plan section) Progress towards PT goals: Progressing toward goals    Frequency  7X/week    PT Plan      Co-evaluation             End of Session Equipment Utilized During Treatment: Gait belt;Left knee immobilizer Activity Tolerance: Patient limited by pain;Treatment limited secondary to medical complications (Comment) Patient left: in chair;with call bell/phone within reach     Time: 1335-1402 PT Time Calculation (min): 27 min  Charges:  $Gait Training: 8-22 mins $Therapeutic Exercise: 8-22 mins $Therapeutic Activity: 8-22 mins                    G Codes:      Rica Koyanagi  PTA [facility]  Acute  Rehab Pager      [phone]

## 2014-02-03 NOTE — Progress Notes (Signed)
Subjective:  Patient reports pain as improved.  Objective:   VITALS:   Filed Vitals:   02/02/14 1428 02/02/14 2130 02/03/14 0550 02/03/14 0938  BP: 139/92 143/80 120/72 127/74  Pulse: 112 124 98   Temp: 99.9 F (37.7 C) 98.9 F (37.2 C) 98.4 F (36.9 C)   TempSrc: Oral Oral Oral   Resp: 18 16 16    Height:      Weight:      SpO2: 98% 95% 97%     Incision c/d/i No signs of infection   Lab Results  Component Value Date   WBC 7.9 02/03/2014   HGB 9.2* 02/03/2014   HCT 27.1* 02/03/2014   MCV 92.2 02/03/2014   PLT 123* 02/03/2014     Assessment/Plan:  2 Days Post-Op   - Expected postop acute blood loss anemia - will monitor for symptoms - Up with PT/OT - rec HHPT - DVT ppx - SCDs, ambulation, asa - WBAT left and lower extremity - Pain control - Discharge planning - likely dc home tomorrow - dressings changed  Problem List Items Addressed This Visit   None       [place] Eduard Roux 02/03/2014, 10:09 AM [phone]

## 2014-02-03 NOTE — Progress Notes (Signed)
Physical Therapy Treatment Patient Details Name: Charles [name] MRN: [id number] DOB: 1966/06/29 Today's Date: 02/03/2014    History of Present Illness s/p L TKA.      PT Comments    POD # 2 am session.  Pt OOB in recliner via OT.  Applied KI and instructed pt on use for amb.  Assisted out of recliner to amb in hallway required + 2 for safety as pt demon poor unsteady gait and required increased VC's for proper gait.  Performed TKR TE's then applied ICE.  Follow Up Recommendations  Home health PT;Supervision/Assistance - 24 hour     Equipment Recommendations  Rolling walker with 5" wheels    Recommendations for Other Services       Precautions / Restrictions Precautions Precautions: Knee;Fall Precaution Comments: Instructed pt on KI use for amb/stairs Required Braces or Orthoses: Knee Immobilizer - Left Knee Immobilizer - Left: Discontinue once straight leg raise with < 10 degree lag Restrictions Weight Bearing Restrictions: No LLE Weight Bearing: Weight bearing as tolerated    Mobility  Bed Mobility               General bed mobility comments: Pt OOB in recliner  Transfers Overall transfer level: Needs assistance Equipment used: Rolling walker (2 wheeled) Transfers: Sit to/from Stand Sit to Stand: Min assist         General transfer comment: 50% VC's on proper tech and hand placement  Ambulation/Gait Ambulation/Gait assistance: Min assist Ambulation Distance (Feet): 85 Feet Assistive device: Rolling walker (2 wheeled) Gait Pattern/deviations: Step-to pattern;Trunk flexed;Decreased stance time - left Gait velocity: decr   General Gait Details: 75% VC's on proper gait sequencing and proper walker to self distance.  Also required increased time and cueing for safety to negociate around obsticles.    Stairs            Wheelchair Mobility    Modified Rankin (Stroke Patients Only)       Balance                                    Cognition                            Exercises   Total Knee Replacement TE's 10 reps B LE ankle pumps 10 reps towel squeezes 10 reps knee presses 10 reps heel slides  10 reps SAQ's 10 reps SLR's 10 reps ABD Followed by ICE     General Comments        Pertinent Vitals/Pain C/o 8/10  Pre medicated ICE applied    Home Living                      Prior Function            PT Goals (current goals can now be found in the care plan section) Progress towards PT goals: Progressing toward goals    Frequency  7X/week    PT Plan      Co-evaluation             End of Session Equipment Utilized During Treatment: Gait belt;Left knee immobilizer Activity Tolerance: Patient limited by pain;Treatment limited secondary to medical complications (Comment) Patient left: in chair;with call bell/phone within reach     Time: 1020-1100 PT Time Calculation (min): 40 min  Charges:  $Gait Training: 8-22 mins $Therapeutic  Exercise: 8-22 mins $Therapeutic Activity: 8-22 mins                    G Codes:      Rica Koyanagi  PTA [facility]  Acute  Rehab Pager      [phone]

## 2014-02-04 LAB — CBC
HCT: 25.8 % — ABNORMAL LOW (ref 39.0–52.0)
Hemoglobin: 8.6 g/dL — ABNORMAL LOW (ref 13.0–17.0)
MCH: 31.3 pg (ref 26.0–34.0)
MCHC: 33.3 g/dL (ref 30.0–36.0)
MCV: 93.8 fL (ref 78.0–100.0)
Platelets: 137 10*3/uL — ABNORMAL LOW (ref 150–400)
RBC: 2.75 MIL/uL — ABNORMAL LOW (ref 4.22–5.81)
RDW: 12.5 % (ref 11.5–15.5)
WBC: 6.8 10*3/uL (ref 4.0–10.5)

## 2014-02-04 MED ORDER — FERROUS SULFATE 325 (65 FE) MG PO TABS: 325.0000 mg | ORAL_TABLET | Freq: Three times a day (TID) | ORAL | Status: DC

## 2014-02-04 NOTE — Progress Notes (Signed)
Physical Therapy Treatment Patient Details Name: Charles [name] MRN: [id number] DOB: 01/22/1966 Today's Date: 02/04/2014    History of Present Illness s/p L TKA.      PT Comments    POD # 3 pt ready for D/C to home.  Applied KI and instructed pt to wear for amb until able to perform 10 active SLR but DO wear for stairs.  Practiced 4 steps one rail and one crutch pt required 50% VC's on proper tech and sequencing.  Instructed on use of ICE.  Instructed on HEP and given handout TKR TE's.    Follow Up Recommendations  Home health PT;Supervision/Assistance - 24 hour     Equipment Recommendations  Rolling walker with 5" wheels    Recommendations for Other Services       Precautions / Restrictions Precautions Precautions: Knee;Fall Precaution Comments: Instructed pt on KI use for amb/stairs Required Braces or Orthoses: Knee Immobilizer - Left Knee Immobilizer - Left: Discontinue once straight leg raise with < 10 degree lag Restrictions Weight Bearing Restrictions: Yes LLE Weight Bearing: Weight bearing as tolerated    Mobility  Bed Mobility               General bed mobility comments: Pt OOB in recliner  Transfers Overall transfer level: Needs assistance Equipment used: Rolling walker (2 wheeled) Transfers: Sit to/from Stand Sit to Stand: Min guard         General transfer comment: verbal cues for hand placement and foot placement  Ambulation/Gait Ambulation/Gait assistance: Min guard;Min assist Ambulation Distance (Feet): 75 Feet Assistive device: Rolling walker (2 wheeled) Gait Pattern/deviations: Step-to pattern;Decreased stance time - left;Trunk flexed Gait velocity: decr   General Gait Details: 25% VC's up right posture and safety with turns.  Mildly unsteady but slow/safe.     Stairs Stairs: Yes Stairs assistance: Min assist Stair Management: One rail Left;Step to pattern;Forwards;With crutches Number of Stairs: 4 General stair comments: 50%  VC's on proper tech and to wear KI for increased support.  Crutch issued for pt to take home.   Wheelchair Mobility    Modified Rankin (Stroke Patients Only)       Balance                                    Cognition Arousal/Alertness: Awake/alert Behavior During Therapy: Flat affect Overall Cognitive Status: Within Functional Limits for tasks assessed                      Exercises      General Comments        Pertinent Vitals/Pain     Home Living                      Prior Function            PT Goals (current goals can now be found in the care plan section) Progress towards PT goals: Progressing toward goals    Frequency       PT Plan Current plan remains appropriate    Co-evaluation             End of Session Equipment Utilized During Treatment: Gait belt;Left knee immobilizer Activity Tolerance: Patient tolerated treatment well Patient left: in chair;with call bell/phone within reach     Time: 0931-0958 PT Time Calculation (min): 27 min  Charges:  $Gait Training: 8-22 mins $Self Care/Home  Management: 8-22                    G Codes:      Charles [name]  PTA [facility]  Acute  Rehab Pager      [phone]

## 2014-02-04 NOTE — Progress Notes (Signed)
Occupational Therapy Treatment Patient Details Name: YANNI QUIROA MRN: [id number] DOB: 03-20-1966 Today's Date: 02/04/2014    History of present illness s/p L TKA.     OT comments  Pt hard to understand at times     Equipment Recommendations  3 in 1 bedside comode;Tub/shower bench       Precautions / Restrictions Precautions Precautions: Knee;Fall Required Braces or Orthoses: Knee Immobilizer - Left Knee Immobilizer - Left: Discontinue once straight leg raise with < 10 degree lag Restrictions Weight Bearing Restrictions: Yes LLE Weight Bearing: Weight bearing as tolerated       Mobility Bed Mobility                  Transfers Overall transfer level: Needs assistance Equipment used: Rolling walker (2 wheeled) Transfers: Sit to/from Stand Sit to Stand: Min guard         General transfer comment: verbal cues for hand placement and foot placement        ADL                                         General ADL Comments: OT brought tub bench to room and demonstrated use. Pt declined to practice- but verbalized understanding. Instructed pt to have help with tub transfer first few times. Educated in use of AE.  Pt verbalized and demonstrated understanding.                Cognition   Behavior During Therapy: Flat affect Overall Cognitive Status: Within Functional Limits for tasks assessed                                      Home Living                                                    Plan Discharge plan remains appropriate    Co-evaluation                 End of Session Equipment Utilized During Treatment: Rolling walker   Activity Tolerance Patient limited by pain   Patient Left in chair   Nurse Communication Mobility status        Time: 6384-6659 OT Time Calculation (min): 24 min  Charges: OT General Charges $OT Visit: 1 Procedure OT Evaluation $Initial OT  Evaluation Tier I: 1 Procedure OT Treatments $Self Care/Home Management : 23-37 mins  Betsy Pries 02/04/2014, 9:24 AM

## 2014-02-04 NOTE — Discharge Summary (Signed)
Patient ID: Charles [name] MRN: [id number] DOB/AGE: 1965/12/17 48 y.o.  Admit date: 02/01/2014 Discharge date: 02/04/2014  Admission Diagnoses:  Principal Problem:   Arthritis of knee, left Active Problems:   Status post total knee replacement   Discharge Diagnoses:  Same  Past Medical History  Diagnosis Date  . Benign localized hyperplasia of prostate without urinary obstruction and other lower urinary tract symptoms (LUTS)   . Hypertrophy of prostate with urinary obstruction and other lower urinary tract symptoms (LUTS)   . Personal history of colonic polyps   . Anxiety state, unspecified   . Diarrhea   . Family history of colonic polyps   . Esophageal reflux   . Abdominal pain, left lower quadrant   . Infectious diarrhea(009.2)   . Nausea alone   . Stricture and stenosis of esophagus   . Esophagitis, unspecified   . Duodenitis without mention of hemorrhage   . Dermatophytosis of the body   . Carbuncle and furuncle of unspecified site   . Barrett's esophagus   . Methadone dependence   . Hypertension     [facility]   617  [phone]  . Migraine   . Spontaneous pneumothorax 1984-1988    "3 times"  . History of blood transfusion 1989    "that's how I contracted Hepatitis"  . Chronic lower back pain   . Arthritis 05/04/2012    "just dx'd w/very aggressive form"  . PTSD (post-traumatic stress disorder)   . Substance abuse late 1980's    methadone dependancy  . Ulcer     gastric ulcers  . Nausea with vomiting   . Acute hepatitis C without mention of hepatic coma 1989    Surgeries: Procedure(s): LEFT TOTAL KNEE ARTHROPLASTY on 02/01/2014   Consultants:    Discharged Condition: Improved  Hospital Course: Charles [name] is an 48 y.o. male who was admitted 02/01/2014 for operative treatment ofArthritis of knee, left. Patient has severe unremitting pain that affects sleep, daily activities, and work/hobbies. After pre-op clearance the patient was taken to the  operating room on 02/01/2014 and underwent  Procedure(s): LEFT TOTAL KNEE ARTHROPLASTY.    Patient was given perioperative antibiotics: Anti-infectives   Start     Dose/Rate Route Frequency Ordered Stop   02/01/14 1600  ceFAZolin (ANCEF) IVPB 1 g/50 mL premix     1 g 100 mL/hr over 30 Minutes Intravenous Every 6 hours 02/01/14 1112 02/01/14 2215   02/01/14 0618  ceFAZolin (ANCEF) IVPB 2 g/50 mL premix     2 g 100 mL/hr over 30 Minutes Intravenous On call to O.R. 02/01/14 2585 02/01/14 2778       Patient was given sequential compression devices, early ambulation, and chemoprophylaxis to prevent DVT.  Patient benefited maximally from hospital stay and there were no complications.    Recent vital signs: Patient Vitals for the past 24 hrs:  BP Temp Temp src Pulse Resp SpO2  02/04/14 0241 - 98.8 F (37.1 C) Oral - - -  02/04/14 0000 - - - - 20 92 %  02/03/14 2316 - 101 F (38.3 C) Oral - - -  02/03/14 2030 135/84 mmHg 100.2 F (37.9 C) Oral 125 20 99 %  02/03/14 2000 - - - - 18 98 %  02/03/14 1502 135/75 mmHg 99.2 F (37.3 C) Oral 118 18 98 %  02/03/14 0938 127/74 mmHg - - - - -     Recent laboratory studies:  Recent Labs  02/02/14 0440 02/03/14 0523 02/04/14  0447  WBC 6.5 7.9 6.8  HGB 10.5* 9.2* 8.6*  HCT 30.0* 27.1* 25.8*  PLT 125* 123* 137*  NA 134*  --   --   K 3.9  --   --   CL 97  --   --   CO2 27  --   --   BUN 15  --   --   CREATININE 0.91  --   --   GLUCOSE 132*  --   --   CALCIUM 8.2*  --   --      Discharge Medications:     Medication List    STOP taking these medications       carisoprodol 350 MG tablet  Commonly known as:  SOMA     HYDROcodone-acetaminophen 5-325 MG per tablet  Commonly known as:  NORCO/VICODIN      TAKE these medications       aspirin 325 MG EC tablet  Take 1 tablet (325 mg total) by mouth 2 (two) times daily after a meal.     esomeprazole 40 MG capsule  Commonly known as:  NEXIUM  Take 1 capsule (40 mg total) by mouth  2 (two) times daily.     ferrous sulfate 325 (65 FE) MG tablet  Commonly known as:  FERROUSUL  Take 1 tablet (325 mg total) by mouth 3 (three) times daily with meals.     lisinopril 20 MG tablet  Commonly known as:  PRINIVIL,ZESTRIL  Take 20 mg by mouth 2 (two) times daily.     oxyCODONE-acetaminophen 5-325 MG per tablet  Commonly known as:  ROXICET  Take 1-2 tablets by mouth every 4 (four) hours as needed for severe pain.     promethazine 25 MG tablet  Commonly known as:  PHENERGAN  Take 25 mg by mouth every 6 (six) hours as needed for nausea or vomiting.     silver sulfADIAZINE 1 % cream  Commonly known as:  SILVADENE  Apply 1 application topically daily as needed. Applied to wounds. (arms and legs) as needed     XANAX 1 MG tablet  Generic drug:  ALPRAZolam  Take 1 mg by mouth 3 (three) times daily.        Diagnostic Studies: Dg Chest 2 View  01/28/2014   CLINICAL DATA:  Hypertension.  EXAM: CHEST  2 VIEW  COMPARISON:  DG CHEST 2 VIEW dated 11/11/2012  FINDINGS: Mediastinum and hilar structures are normal. Lungs are clear. Heart size normal. No pleural effusion or pneumothorax.  IMPRESSION: No acute cardiopulmonary disease.   Electronically Signed   By: Marcello Moores  Register   On: 01/28/2014 12:29   Dg Knee Left Port  02/01/2014   CLINICAL DATA:  Left total knee replacement  EXAM: PORTABLE LEFT KNEE - 1-2 VIEW  COMPARISON:  None.  FINDINGS: There is been total knee replacement. Components appear grossly well positioned. Soft tissue drain in place in the suprapatellar region. No evidence of retained foreign object or fracture.  IMPRESSION: Good appearance following total knee replacement. No unexpected finding.   Electronically Signed   By: Nelson Chimes M.D.   On: 02/01/2014 11:16    Disposition: 01-Home or Self Care      Discharge Orders   Future Orders Complete By Expires   Discharge patient  As directed    Discharge wound care:  As directed    Elevate operative extremity  As  directed    Weight bearing as tolerated  As directed    Questions:  Laterality:     Extremity:        Follow-up Information   Follow up with [facility]. [facility])    Contact information:   [address] [place] [address] [phone]        Signed: Mcarthur Rossetti 02/04/2014, 7:20 AM

## 2014-02-04 NOTE — Progress Notes (Signed)
Subjective: 3 Days Post-Op Procedure(s) (LRB): LEFT TOTAL KNEE ARTHROPLASTY (Left) Patient reports pain as moderate.  No acute changes.  Asymptomatic acute blood loss anemia.  Objective: Vital signs in last 24 hours: Temp:  [98.8 F (37.1 C)-101 F (38.3 C)] 98.8 F (37.1 C) (05/11 0241) Pulse Rate:  [118-125] 125 (05/10 2030) Resp:  [18-20] 20 (05/11 0000) BP: (127-135)/(74-84) 135/84 mmHg (05/10 2030) SpO2:  [92 %-99 %] 92 % (05/11 0000)  Intake/Output from previous day: 05/10 0701 - 05/11 0700 In: 1380 [P.O.:480; I.V.:900] Out: 900 [Urine:900] Intake/Output this shift:     Recent Labs  02/02/14 0440 02/03/14 0523 02/04/14 0447  HGB 10.5* 9.2* 8.6*    Recent Labs  02/03/14 0523 02/04/14 0447  WBC 7.9 6.8  RBC 2.94* 2.75*  HCT 27.1* 25.8*  PLT 123* 137*    Recent Labs  02/02/14 0440  NA 134*  K 3.9  CL 97  CO2 27  BUN 15  CREATININE 0.91  GLUCOSE 132*  CALCIUM 8.2*   No results found for this basename: LABPT, INR,  in the last 72 hours  Sensation intact distally Intact pulses distally Dorsiflexion/Plantar flexion intact Incision: scant drainage No cellulitis present Compartment soft  Assessment/Plan: 3 Days Post-Op Procedure(s) (LRB): LEFT TOTAL KNEE ARTHROPLASTY (Left) Up with therapy Discharge home with home health  Mcarthur Rossetti 02/04/2014, 7:12 AM

## 2014-02-20 ENCOUNTER — Ambulatory Visit: Payer: Medicaid Other | Attending: Orthopaedic Surgery

## 2014-02-20 DIAGNOSIS — M25669 Stiffness of unspecified knee, not elsewhere classified: Secondary | ICD-10-CM | POA: Insufficient documentation

## 2014-02-20 DIAGNOSIS — M25569 Pain in unspecified knee: Secondary | ICD-10-CM | POA: Insufficient documentation

## 2014-02-20 DIAGNOSIS — R262 Difficulty in walking, not elsewhere classified: Secondary | ICD-10-CM | POA: Diagnosis not present

## 2014-02-20 DIAGNOSIS — IMO0001 Reserved for inherently not codable concepts without codable children: Secondary | ICD-10-CM | POA: Diagnosis not present

## 2014-02-20 DIAGNOSIS — M6281 Muscle weakness (generalized): Secondary | ICD-10-CM | POA: Insufficient documentation

## 2014-02-20 DIAGNOSIS — R609 Edema, unspecified: Secondary | ICD-10-CM | POA: Insufficient documentation

## 2014-02-22 ENCOUNTER — Ambulatory Visit: Payer: Medicaid Other

## 2014-02-27 ENCOUNTER — Ambulatory Visit: Payer: Medicaid Other | Attending: Orthopaedic Surgery | Admitting: Physical Therapy

## 2014-02-27 DIAGNOSIS — IMO0001 Reserved for inherently not codable concepts without codable children: Secondary | ICD-10-CM | POA: Diagnosis not present

## 2014-02-27 DIAGNOSIS — M25669 Stiffness of unspecified knee, not elsewhere classified: Secondary | ICD-10-CM | POA: Insufficient documentation

## 2014-02-27 DIAGNOSIS — R262 Difficulty in walking, not elsewhere classified: Secondary | ICD-10-CM | POA: Insufficient documentation

## 2014-02-27 DIAGNOSIS — R609 Edema, unspecified: Secondary | ICD-10-CM | POA: Diagnosis not present

## 2014-02-27 DIAGNOSIS — M25569 Pain in unspecified knee: Secondary | ICD-10-CM | POA: Diagnosis not present

## 2014-02-27 DIAGNOSIS — M6281 Muscle weakness (generalized): Secondary | ICD-10-CM | POA: Diagnosis not present

## 2014-03-05 ENCOUNTER — Encounter (HOSPITAL_COMMUNITY): Payer: Self-pay | Admitting: Emergency Medicine

## 2014-03-05 ENCOUNTER — Emergency Department (HOSPITAL_COMMUNITY): Payer: Medicaid Other

## 2014-03-05 ENCOUNTER — Emergency Department (HOSPITAL_COMMUNITY)
Admission: EM | Admit: 2014-03-05 | Discharge: 2014-03-05 | Disposition: A | Discharge status: Home / Self Care | Payer: Medicaid Other | Attending: Emergency Medicine | Admitting: Emergency Medicine

## 2014-03-05 DIAGNOSIS — Z7982 Long term (current) use of aspirin: Secondary | ICD-10-CM | POA: Insufficient documentation

## 2014-03-05 DIAGNOSIS — Z8669 Personal history of other diseases of the nervous system and sense organs: Secondary | ICD-10-CM | POA: Insufficient documentation

## 2014-03-05 DIAGNOSIS — Z860100 Personal history of colon polyps, unspecified: Secondary | ICD-10-CM | POA: Insufficient documentation

## 2014-03-05 DIAGNOSIS — Z872 Personal history of diseases of the skin and subcutaneous tissue: Secondary | ICD-10-CM | POA: Insufficient documentation

## 2014-03-05 DIAGNOSIS — M549 Dorsalgia, unspecified: Secondary | ICD-10-CM | POA: Insufficient documentation

## 2014-03-05 DIAGNOSIS — R197 Diarrhea, unspecified: Secondary | ICD-10-CM | POA: Insufficient documentation

## 2014-03-05 DIAGNOSIS — I10 Essential (primary) hypertension: Secondary | ICD-10-CM | POA: Insufficient documentation

## 2014-03-05 DIAGNOSIS — F411 Generalized anxiety disorder: Secondary | ICD-10-CM | POA: Insufficient documentation

## 2014-03-05 DIAGNOSIS — K219 Gastro-esophageal reflux disease without esophagitis: Secondary | ICD-10-CM | POA: Insufficient documentation

## 2014-03-05 DIAGNOSIS — F17200 Nicotine dependence, unspecified, uncomplicated: Secondary | ICD-10-CM | POA: Insufficient documentation

## 2014-03-05 DIAGNOSIS — Z9089 Acquired absence of other organs: Secondary | ICD-10-CM | POA: Insufficient documentation

## 2014-03-05 DIAGNOSIS — Z79899 Other long term (current) drug therapy: Secondary | ICD-10-CM | POA: Insufficient documentation

## 2014-03-05 DIAGNOSIS — K2270 Barrett's esophagus without dysplasia: Secondary | ICD-10-CM | POA: Insufficient documentation

## 2014-03-05 DIAGNOSIS — R112 Nausea with vomiting, unspecified: Secondary | ICD-10-CM | POA: Insufficient documentation

## 2014-03-05 DIAGNOSIS — G8929 Other chronic pain: Secondary | ICD-10-CM | POA: Insufficient documentation

## 2014-03-05 LAB — CBC
HCT: 45 % (ref 39.0–52.0)
Hemoglobin: 15.2 g/dL (ref 13.0–17.0)
MCH: 32.8 pg (ref 26.0–34.0)
MCHC: 33.8 g/dL (ref 30.0–36.0)
MCV: 97 fL (ref 78.0–100.0)
PLATELETS: 223 10*3/uL (ref 150–400)
RBC: 4.64 MIL/uL (ref 4.22–5.81)
RDW: 13.3 % (ref 11.5–15.5)
WBC: 6.8 10*3/uL (ref 4.0–10.5)

## 2014-03-05 LAB — COMPREHENSIVE METABOLIC PANEL
ALT: 19 U/L (ref 0–53)
AST: 17 U/L (ref 0–37)
Albumin: 4.4 g/dL (ref 3.5–5.2)
Alkaline Phosphatase: 98 U/L (ref 39–117)
BUN: 7 mg/dL (ref 6–23)
CALCIUM: 10 mg/dL (ref 8.4–10.5)
CO2: 25 mEq/L (ref 19–32)
CREATININE: 0.68 mg/dL (ref 0.50–1.35)
Chloride: 102 mEq/L (ref 96–112)
GFR calc Af Amer: >90 mL/min (ref >90)
GFR calc non Af Amer: >90 mL/min (ref >90)
Glucose, Bld: 101 mg/dL — ABNORMAL HIGH (ref 70–99)
Potassium: 3.8 mEq/L (ref 3.7–5.3)
SODIUM: 142 meq/L (ref 137–147)
Total Bilirubin: 0.2 mg/dL — ABNORMAL LOW (ref 0.3–1.2)
Total Protein: 7.3 g/dL (ref 6.0–8.3)

## 2014-03-05 LAB — TROPONIN I

## 2014-03-05 LAB — LIPASE, BLOOD: Lipase: 31 U/L (ref 11–59)

## 2014-03-05 MED ORDER — ONDANSETRON 8 MG PO TBDP: 8.0000 mg | ORAL_TABLET | Freq: Three times a day (TID) | ORAL | Status: DC | PRN

## 2014-03-05 MED ORDER — SODIUM CHLORIDE 0.9 % IV BOLUS (SEPSIS)
2000.0000 mL | Freq: Once | INTRAVENOUS | Status: AC
  Administered 2014-03-05: 2000 mL via INTRAVENOUS

## 2014-03-05 MED ORDER — ONDANSETRON HCL 4 MG/2ML IJ SOLN
4.0000 mg | Freq: Once | INTRAMUSCULAR | Status: AC
  Administered 2014-03-05: 4 mg via INTRAVENOUS
  Filled 2014-03-05: qty 2

## 2014-03-05 MED ORDER — OXYCODONE-ACETAMINOPHEN 5-325 MG PO TABS
1.0000 | ORAL_TABLET | Freq: Once | ORAL | Status: AC
  Administered 2014-03-05: 1 via ORAL
  Filled 2014-03-05: qty 1

## 2014-03-05 MED ORDER — HYDROMORPHONE HCL PF 1 MG/ML IJ SOLN
1.0000 mg | Freq: Once | INTRAMUSCULAR | Status: AC
  Administered 2014-03-05: 1 mg via INTRAVENOUS
  Filled 2014-03-05: qty 1

## 2014-03-05 NOTE — ED Notes (Addendum)
Pt has multiple complaints. Pt c/o nausea and vomiting, throat hurting and swelling and not being able to keep anything down. States when he swallows its painful. Pt c/o abd pain and left knee pain from surgery on May 8. Pt keeps stating " I feel bad and I hurt bad"

## 2014-03-05 NOTE — ED Notes (Signed)
Pt requesting Pain meds - RN Debbie aware.

## 2014-03-06 ENCOUNTER — Ambulatory Visit (INDEPENDENT_AMBULATORY_CARE_PROVIDER_SITE_OTHER): Payer: Medicaid Other | Admitting: Gastroenterology

## 2014-03-06 ENCOUNTER — Telehealth: Payer: Self-pay | Admitting: Gastroenterology

## 2014-03-06 ENCOUNTER — Encounter: Payer: Self-pay | Admitting: Gastroenterology

## 2014-03-06 ENCOUNTER — Ambulatory Visit (INDEPENDENT_AMBULATORY_CARE_PROVIDER_SITE_OTHER)
Admission: RE | Admit: 2014-03-06 | Discharge: 2014-03-06 | Disposition: A | Discharge status: Home / Self Care | Payer: Medicaid Other | Source: Ambulatory Visit | Attending: Gastroenterology | Admitting: Gastroenterology

## 2014-03-06 VITALS — BP 140/70 | HR 80 | Temp 98.8°F | Ht 70.75 in | Wt 188.4 lb

## 2014-03-06 DIAGNOSIS — R112 Nausea with vomiting, unspecified: Secondary | ICD-10-CM

## 2014-03-06 DIAGNOSIS — R109 Unspecified abdominal pain: Secondary | ICD-10-CM

## 2014-03-06 MED ORDER — ONDANSETRON 4 MG PO TBDP: 4.0000 mg | ORAL_TABLET | ORAL | Status: DC | PRN
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
## 2014-03-06 NOTE — Telephone Encounter (Signed)
Pt states he had knee surgery recently and since the surgery he has had trouble keeping food down. Pt c/o nausea and vomiting, epigastric pain, and the left side of his throat hurting. Pt states he can't keep anything down and he has trouble taking meds. Pt reports this got worse this week and he was seen in the ER yesterday and given IV fluids and nausea meds. Pt scheduled to see Alonza Bogus PA today at 2:30pm. Pt aware of appt.

## 2014-03-06 NOTE — Patient Instructions (Signed)
We have sent the following medications to your pharmacy for you to pick up at your convenience: Zofran 4 mg, please place under tongue every four hours as needed for nausea  If you continue to have nausea and vomiting and cannot tolerate liquids then you need to go back to emergency room.  Please go to the basement for X-ray before leaving today.

## 2014-03-06 NOTE — Progress Notes (Signed)
     03/06/2014 Charles CARRAS [id number] 1966-08-10   History of Present Illness:  This is a 48 year old male who is known to Dr. Deatra Ina for previous complaints of abdominal pain, particularly right upper quadrant. He has not been seen since May 2014 for these complaints. The patient comes in today complaining of left-sided abdominal pain with nausea and vomiting. He states that he had his knee replaced on May 8 and had some issues with nausea and vomiting since the surgery, but they have been progressively worsening over the last several weeks, particularly days. He says that the last couple days he has not been able to tolerate any food and even minimal liquids. Sometimes he cannot even keep his medications down. He is taking Phenergan, which does not seem to be helping very much. He is on Nexium 40 mg twice daily for heartburn/reflux issues. He reports the pain he is having is on the left side of his abdomen this is different than any of the pain that he has had previously. He is moving his bowels. He was in the emergency department yesterday at which time a CBC, CMP, and lipase were unremarkable.   Current Medications, Allergies, Past Medical History, Past Surgical History, Family History and Social History were reviewed in [employer] record.   Physical Exam: BP 140/70  Pulse 80  Temp(Src) 98.8 F (37.1 C)  Ht 5' 10.75" (1.797 m)  Wt 188 lb 6.4 oz (85.458 kg)  BMI 26.46 kg/m2 General: Well developed white male in no acute distress Head: Normocephalic and atraumatic Eyes:  Sclerae anicteric, conjunctiva pink  Ears: Normal auditory acuity Lungs: Clear throughout to auscultation Heart: Regular rate and rhythm Abdomen: Soft, non-distended.  Normal bowel sounds.  Mild diffuse TTP without R/R/G.  Abdominal exam benign. Musculoskeletal: Symmetrical with no gross deformities  Extremities: No edema  Neurological: Alert oriented x 4, grossly  non-focal Psychological:  Alert and cooperative. Normal mood and affect  Assessment and Recommendations: -Abdominal pain with nausea and vomiting:  Abdominal exam is benign.  Labs in ED yesterday were normal.  Will check abdominal x-rays (2 view).  We will give him Zofran to try for his nausea which he can use alone or alternate with Phenergan. If his nausea and vomiting continue and he he is unable to tolerate liquids then he needs to return to the emergency department. Otherwise, if the anti-emetics help but he continues with complaints of abdominal pain could consider checking CT scan of the abdomen and pelvis with contrast.  ? Source of his symptoms. ? Viral in origin but it seem prolonged.

## 2014-03-07 NOTE — Progress Notes (Signed)
Is he taking narcotics regularly?  If so he should stop since can account for his symptoms.

## 2014-03-10 ENCOUNTER — Other Ambulatory Visit: Payer: Self-pay | Admitting: Gastroenterology

## 2014-03-10 ENCOUNTER — Telehealth: Payer: Self-pay | Admitting: Gastroenterology

## 2014-03-10 MED ORDER — ONDANSETRON 4 MG PO TBDP: 4.0000 mg | ORAL_TABLET | ORAL | Status: DC | PRN

## 2014-03-10 NOTE — Telephone Encounter (Signed)
C/o nausea and vomiting.  Need zofran refill. Will have contact him to schedule EGD this week.

## 2014-03-11 ENCOUNTER — Other Ambulatory Visit: Payer: Self-pay

## 2014-03-11 DIAGNOSIS — R109 Unspecified abdominal pain: Secondary | ICD-10-CM

## 2014-03-11 NOTE — Telephone Encounter (Signed)
If he is taking any narcotics he should discontinue them.  Should he not be on narcotics then I would obtain a gastric emptying scan.  If negative he will need upper endoscopy.  If he still is taking narcotics have him call back again at and of week if he still having nausea at which point get a gastric emptying scan.  We'll reassess for endoscopy after above

## 2014-03-11 NOTE — Telephone Encounter (Signed)
Dr. Deatra Ina there are no available appts for an EGD this week. Please advise.

## 2014-03-11 NOTE — Telephone Encounter (Signed)
Pt states he is not taking the pain medication now. Pt scheduled for GES at [facility] 03/18/14@7am . Pt to arrive there at 6:45am. Pt to be NPO after midnight and hold stomach meds for 24 hours prior to GES. Pt aware.

## 2014-03-13 ENCOUNTER — Ambulatory Visit: Payer: Medicaid Other

## 2014-03-13 DIAGNOSIS — IMO0001 Reserved for inherently not codable concepts without codable children: Secondary | ICD-10-CM | POA: Diagnosis not present

## 2014-03-18 ENCOUNTER — Encounter (HOSPITAL_COMMUNITY)
Admission: RE | Admit: 2014-03-18 | Discharge: 2014-03-18 | Disposition: A | Discharge status: Home / Self Care | Payer: Medicaid Other | Source: Ambulatory Visit | Attending: Gastroenterology | Admitting: Gastroenterology

## 2014-03-18 DIAGNOSIS — R109 Unspecified abdominal pain: Secondary | ICD-10-CM | POA: Diagnosis present

## 2014-03-18 MED ORDER — TECHNETIUM TC 99M SULFUR COLLOID
2.0000 | Freq: Once | INTRAVENOUS | Status: AC | PRN
  Administered 2014-03-18: 2 via INTRAVENOUS

## 2014-03-20 ENCOUNTER — Ambulatory Visit: Payer: Medicaid Other

## 2014-03-20 DIAGNOSIS — IMO0001 Reserved for inherently not codable concepts without codable children: Secondary | ICD-10-CM | POA: Diagnosis not present

## 2014-03-27 ENCOUNTER — Ambulatory Visit: Payer: Medicaid Other | Admitting: Physical Therapy

## 2014-04-03 ENCOUNTER — Ambulatory Visit: Payer: Medicaid Other | Admitting: Physical Therapy

## 2014-04-10 ENCOUNTER — Ambulatory Visit: Payer: Medicaid Other | Attending: Orthopaedic Surgery | Admitting: Physical Therapy

## 2014-04-10 DIAGNOSIS — R609 Edema, unspecified: Secondary | ICD-10-CM | POA: Insufficient documentation

## 2014-04-10 DIAGNOSIS — R262 Difficulty in walking, not elsewhere classified: Secondary | ICD-10-CM | POA: Insufficient documentation

## 2014-04-10 DIAGNOSIS — M25669 Stiffness of unspecified knee, not elsewhere classified: Secondary | ICD-10-CM | POA: Insufficient documentation

## 2014-04-10 DIAGNOSIS — M6281 Muscle weakness (generalized): Secondary | ICD-10-CM | POA: Insufficient documentation

## 2014-04-10 DIAGNOSIS — IMO0001 Reserved for inherently not codable concepts without codable children: Secondary | ICD-10-CM | POA: Insufficient documentation

## 2014-04-10 DIAGNOSIS — M25569 Pain in unspecified knee: Secondary | ICD-10-CM | POA: Insufficient documentation

## 2014-04-17 ENCOUNTER — Ambulatory Visit: Payer: Medicaid Other | Admitting: Physical Therapy

## 2014-07-09 ENCOUNTER — Encounter (HOSPITAL_COMMUNITY): Payer: Self-pay | Admitting: Emergency Medicine

## 2014-07-09 ENCOUNTER — Emergency Department (HOSPITAL_COMMUNITY)
Admission: EM | Admit: 2014-07-09 | Discharge: 2014-07-09 | Disposition: A | Discharge status: Home / Self Care | Payer: Medicaid Other | Attending: Emergency Medicine | Admitting: Emergency Medicine

## 2014-07-09 DIAGNOSIS — Z872 Personal history of diseases of the skin and subcutaneous tissue: Secondary | ICD-10-CM | POA: Diagnosis not present

## 2014-07-09 DIAGNOSIS — Z8619 Personal history of other infectious and parasitic diseases: Secondary | ICD-10-CM | POA: Diagnosis not present

## 2014-07-09 DIAGNOSIS — Z86010 Personal history of colonic polyps: Secondary | ICD-10-CM | POA: Insufficient documentation

## 2014-07-09 DIAGNOSIS — Z8669 Personal history of other diseases of the nervous system and sense organs: Secondary | ICD-10-CM | POA: Diagnosis not present

## 2014-07-09 DIAGNOSIS — Z8739 Personal history of other diseases of the musculoskeletal system and connective tissue: Secondary | ICD-10-CM | POA: Insufficient documentation

## 2014-07-09 DIAGNOSIS — F419 Anxiety disorder, unspecified: Secondary | ICD-10-CM | POA: Diagnosis not present

## 2014-07-09 DIAGNOSIS — Z79899 Other long term (current) drug therapy: Secondary | ICD-10-CM | POA: Insufficient documentation

## 2014-07-09 DIAGNOSIS — Z87438 Personal history of other diseases of male genital organs: Secondary | ICD-10-CM | POA: Diagnosis not present

## 2014-07-09 DIAGNOSIS — Z8709 Personal history of other diseases of the respiratory system: Secondary | ICD-10-CM | POA: Insufficient documentation

## 2014-07-09 DIAGNOSIS — K219 Gastro-esophageal reflux disease without esophagitis: Secondary | ICD-10-CM | POA: Insufficient documentation

## 2014-07-09 DIAGNOSIS — R197 Diarrhea, unspecified: Secondary | ICD-10-CM | POA: Insufficient documentation

## 2014-07-09 DIAGNOSIS — G8929 Other chronic pain: Secondary | ICD-10-CM | POA: Insufficient documentation

## 2014-07-09 DIAGNOSIS — Z720 Tobacco use: Secondary | ICD-10-CM | POA: Insufficient documentation

## 2014-07-09 DIAGNOSIS — R0789 Other chest pain: Secondary | ICD-10-CM | POA: Insufficient documentation

## 2014-07-09 DIAGNOSIS — I10 Essential (primary) hypertension: Secondary | ICD-10-CM | POA: Insufficient documentation

## 2014-07-09 LAB — CBC WITH DIFFERENTIAL/PLATELET
BASOS ABS: 0 10*3/uL (ref 0.0–0.1)
BASOS PCT: 0 % (ref 0–1)
EOS PCT: 1 % (ref 0–5)
Eosinophils Absolute: 0.1 10*3/uL (ref 0.0–0.7)
HEMATOCRIT: 44 % (ref 39.0–52.0)
Hemoglobin: 15.2 g/dL (ref 13.0–17.0)
Lymphocytes Relative: 25 % (ref 12–46)
Lymphs Abs: 1.9 10*3/uL (ref 0.7–4.0)
MCH: 30.8 pg (ref 26.0–34.0)
MCHC: 34.5 g/dL (ref 30.0–36.0)
MCV: 89.2 fL (ref 78.0–100.0)
MONOS PCT: 9 % (ref 3–12)
Monocytes Absolute: 0.7 10*3/uL (ref 0.1–1.0)
Neutro Abs: 4.9 10*3/uL (ref 1.7–7.7)
Neutrophils Relative %: 65 % (ref 43–77)
Platelets: 338 10*3/uL (ref 150–400)
RBC: 4.93 MIL/uL (ref 4.22–5.81)
RDW: 12.9 % (ref 11.5–15.5)
WBC: 7.5 10*3/uL (ref 4.0–10.5)

## 2014-07-09 LAB — URINALYSIS, ROUTINE W REFLEX MICROSCOPIC
BILIRUBIN URINE: NEGATIVE
Glucose, UA: NEGATIVE mg/dL
Hgb urine dipstick: NEGATIVE
Ketones, ur: NEGATIVE mg/dL
LEUKOCYTES UA: NEGATIVE
NITRITE: NEGATIVE
Protein, ur: NEGATIVE mg/dL
SPECIFIC GRAVITY, URINE: 1.029 (ref 1.005–1.030)
UROBILINOGEN UA: 1 mg/dL (ref 0.0–1.0)
pH: 6 (ref 5.0–8.0)

## 2014-07-09 LAB — PROTIME-INR
INR: 1.05 (ref 0.00–1.49)
Prothrombin Time: 13.8 seconds (ref 11.6–15.2)

## 2014-07-09 LAB — COMPREHENSIVE METABOLIC PANEL
ALBUMIN: 5 g/dL (ref 3.5–5.2)
ALT: 16 U/L (ref 0–53)
AST: 17 U/L (ref 0–37)
Alkaline Phosphatase: 110 U/L (ref 39–117)
Anion gap: 17 — ABNORMAL HIGH (ref 5–15)
BILIRUBIN TOTAL: 0.7 mg/dL (ref 0.3–1.2)
BUN: 15 mg/dL (ref 6–23)
CO2: 22 mEq/L (ref 19–32)
CREATININE: 0.85 mg/dL (ref 0.50–1.35)
Calcium: 9.9 mg/dL (ref 8.4–10.5)
Chloride: 100 mEq/L (ref 96–112)
GFR calc Af Amer: >90 mL/min (ref >90)
GFR calc non Af Amer: >90 mL/min (ref >90)
Glucose, Bld: 98 mg/dL (ref 70–99)
Potassium: 4 mEq/L (ref 3.7–5.3)
Sodium: 139 mEq/L (ref 137–147)
TOTAL PROTEIN: 8.7 g/dL — AB (ref 6.0–8.3)

## 2014-07-09 LAB — APTT: aPTT: 41 seconds — ABNORMAL HIGH (ref 24–37)

## 2014-07-09 LAB — LIPASE, BLOOD: LIPASE: 64 U/L — AB (ref 11–59)

## 2014-07-09 MED ORDER — SODIUM CHLORIDE 0.9 % IV BOLUS (SEPSIS)
1000.0000 mL | Freq: Once | INTRAVENOUS | Status: AC
  Administered 2014-07-09: 1000 mL via INTRAVENOUS

## 2014-07-09 MED ORDER — FENTANYL CITRATE 0.05 MG/ML IJ SOLN
50.0000 ug | Freq: Once | INTRAMUSCULAR | Status: AC
  Administered 2014-07-09: 50 ug via INTRAVENOUS
  Filled 2014-07-09: qty 2

## 2014-07-09 NOTE — ED Notes (Signed)
Unsuccessful IV attempt x2 by this RN. Ivar Drape at bedside attempting [place] assisted IV.

## 2014-07-09 NOTE — Discharge Instructions (Signed)

## 2014-07-09 NOTE — ED Notes (Signed)
Pt attempting stool sample at present time. 

## 2014-07-09 NOTE — ED Notes (Addendum)
Pt c/o generalized abdominal pain, nausea, and diarrhea x 12 days.  Pain score 3/10.  Pt reports that he started a Hep C treatment x 15 days ago.  Sts he had blood work today, was told that he has a bad infection, and told he needed to either see GI MD or come to ED.

## 2014-07-09 NOTE — ED Notes (Signed)
Pt denies CP at present time; pt reports abdominal pain is the unchanged.

## 2014-07-09 NOTE — ED Notes (Signed)
Pt reports taking new medication for Hep C 15 days ago; pt reports new symptoms of chest pain, SOB with exertion, abdominal pain with diarrhea, and generalized weakness over the past 12 days.
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
## 2014-07-10 LAB — GI PATHOGEN PANEL BY PCR, STOOL
C difficile toxin A/B: NEGATIVE
Campylobacter by PCR: NEGATIVE
Cryptosporidium by PCR: NEGATIVE
E COLI 0157 BY PCR: NEGATIVE
E coli (ETEC) LT/ST: NEGATIVE
E coli (STEC): NEGATIVE
G LAMBLIA BY PCR: NEGATIVE
NOROVIRUS G1/G2: NEGATIVE
Rotavirus A by PCR: NEGATIVE
SALMONELLA BY PCR: NEGATIVE
SHIGELLA BY PCR: NEGATIVE

## 2014-07-22 ENCOUNTER — Other Ambulatory Visit: Payer: Self-pay | Admitting: Gastroenterology

## 2014-08-10 ENCOUNTER — Other Ambulatory Visit: Payer: Self-pay | Admitting: Gastroenterology

## 2014-09-19 ENCOUNTER — Emergency Department (HOSPITAL_COMMUNITY)
Admission: EM | Admit: 2014-09-19 | Discharge: 2014-09-19 | Disposition: A | Discharge status: Home / Self Care | Payer: Medicaid Other | Attending: Emergency Medicine | Admitting: Emergency Medicine

## 2014-09-19 ENCOUNTER — Emergency Department (HOSPITAL_COMMUNITY): Payer: Medicaid Other

## 2014-09-19 ENCOUNTER — Encounter (HOSPITAL_COMMUNITY): Payer: Self-pay | Admitting: Emergency Medicine

## 2014-09-19 DIAGNOSIS — Z720 Tobacco use: Secondary | ICD-10-CM | POA: Insufficient documentation

## 2014-09-19 DIAGNOSIS — Z8709 Personal history of other diseases of the respiratory system: Secondary | ICD-10-CM | POA: Insufficient documentation

## 2014-09-19 DIAGNOSIS — Z9889 Other specified postprocedural states: Secondary | ICD-10-CM | POA: Diagnosis not present

## 2014-09-19 DIAGNOSIS — Z87448 Personal history of other diseases of urinary system: Secondary | ICD-10-CM | POA: Diagnosis not present

## 2014-09-19 DIAGNOSIS — M545 Low back pain, unspecified: Secondary | ICD-10-CM

## 2014-09-19 DIAGNOSIS — Z792 Long term (current) use of antibiotics: Secondary | ICD-10-CM | POA: Insufficient documentation

## 2014-09-19 DIAGNOSIS — R339 Retention of urine, unspecified: Secondary | ICD-10-CM | POA: Insufficient documentation

## 2014-09-19 DIAGNOSIS — Z79899 Other long term (current) drug therapy: Secondary | ICD-10-CM | POA: Insufficient documentation

## 2014-09-19 DIAGNOSIS — F419 Anxiety disorder, unspecified: Secondary | ICD-10-CM | POA: Diagnosis not present

## 2014-09-19 DIAGNOSIS — I10 Essential (primary) hypertension: Secondary | ICD-10-CM | POA: Insufficient documentation

## 2014-09-19 DIAGNOSIS — K529 Noninfective gastroenteritis and colitis, unspecified: Secondary | ICD-10-CM | POA: Insufficient documentation

## 2014-09-19 DIAGNOSIS — G8929 Other chronic pain: Secondary | ICD-10-CM | POA: Insufficient documentation

## 2014-09-19 DIAGNOSIS — K219 Gastro-esophageal reflux disease without esophagitis: Secondary | ICD-10-CM | POA: Diagnosis not present

## 2014-09-19 DIAGNOSIS — Z8619 Personal history of other infectious and parasitic diseases: Secondary | ICD-10-CM | POA: Insufficient documentation

## 2014-09-19 DIAGNOSIS — R109 Unspecified abdominal pain: Secondary | ICD-10-CM | POA: Diagnosis present

## 2014-09-19 DIAGNOSIS — Z872 Personal history of diseases of the skin and subcutaneous tissue: Secondary | ICD-10-CM | POA: Diagnosis not present

## 2014-09-19 DIAGNOSIS — Z86010 Personal history of colonic polyps: Secondary | ICD-10-CM | POA: Insufficient documentation

## 2014-09-19 LAB — CBC WITH DIFFERENTIAL/PLATELET
Basophils Absolute: 0 10*3/uL (ref 0.0–0.1)
Basophils Relative: 0 % (ref 0–1)
Eosinophils Absolute: 0.2 10*3/uL (ref 0.0–0.7)
Eosinophils Relative: 4 % (ref 0–5)
HCT: 41.2 % (ref 39.0–52.0)
HEMOGLOBIN: 13.6 g/dL (ref 13.0–17.0)
Lymphocytes Relative: 20 % (ref 12–46)
Lymphs Abs: 1 10*3/uL (ref 0.7–4.0)
MCH: 31.6 pg (ref 26.0–34.0)
MCHC: 33 g/dL (ref 30.0–36.0)
MCV: 95.6 fL (ref 78.0–100.0)
MONOS PCT: 15 % — AB (ref 3–12)
Monocytes Absolute: 0.8 10*3/uL (ref 0.1–1.0)
Neutro Abs: 3.1 10*3/uL (ref 1.7–7.7)
Neutrophils Relative %: 61 % (ref 43–77)
Platelets: 181 10*3/uL (ref 150–400)
RBC: 4.31 MIL/uL (ref 4.22–5.81)
RDW: 12.1 % (ref 11.5–15.5)
WBC: 5.1 10*3/uL (ref 4.0–10.5)

## 2014-09-19 LAB — COMPREHENSIVE METABOLIC PANEL
ALBUMIN: 4.1 g/dL (ref 3.5–5.2)
ALT: 56 U/L — ABNORMAL HIGH (ref 0–53)
AST: 175 U/L — ABNORMAL HIGH (ref 0–37)
Alkaline Phosphatase: 64 U/L (ref 39–117)
Anion gap: 8 (ref 5–15)
BUN: 34 mg/dL — AB (ref 6–23)
CALCIUM: 8.4 mg/dL (ref 8.4–10.5)
CO2: 24 mmol/L (ref 19–32)
CREATININE: 1.18 mg/dL (ref 0.50–1.35)
Chloride: 101 mEq/L (ref 96–112)
GFR calc Af Amer: 82 mL/min — ABNORMAL LOW (ref >90)
GFR calc non Af Amer: 71 mL/min — ABNORMAL LOW (ref >90)
Glucose, Bld: 111 mg/dL — ABNORMAL HIGH (ref 70–99)
Potassium: 4 mmol/L (ref 3.5–5.1)
Sodium: 133 mmol/L — ABNORMAL LOW (ref 135–145)
Total Bilirubin: 1 mg/dL (ref 0.3–1.2)
Total Protein: 6.7 g/dL (ref 6.0–8.3)

## 2014-09-19 LAB — URINALYSIS, ROUTINE W REFLEX MICROSCOPIC
Bilirubin Urine: NEGATIVE
Glucose, UA: NEGATIVE mg/dL
Hgb urine dipstick: NEGATIVE
Ketones, ur: NEGATIVE mg/dL
Leukocytes, UA: NEGATIVE
NITRITE: NEGATIVE
Protein, ur: NEGATIVE mg/dL
SPECIFIC GRAVITY, URINE: 1.017 (ref 1.005–1.030)
Urobilinogen, UA: 0.2 mg/dL (ref 0.0–1.0)
pH: 6 (ref 5.0–8.0)

## 2014-09-19 LAB — LIPASE, BLOOD: Lipase: 41 U/L (ref 11–59)

## 2014-09-19 MED ORDER — MORPHINE SULFATE 4 MG/ML IJ SOLN
4.0000 mg | Freq: Once | INTRAMUSCULAR | Status: AC
  Administered 2014-09-19: 4 mg via INTRAVENOUS
  Filled 2014-09-19: qty 1

## 2014-09-19 MED ORDER — CIPROFLOXACIN HCL 500 MG PO TABS: 500.0000 mg | ORAL_TABLET | Freq: Two times a day (BID) | ORAL | Status: DC

## 2014-09-19 MED ORDER — METRONIDAZOLE 500 MG PO TABS: 500.0000 mg | ORAL_TABLET | Freq: Two times a day (BID) | ORAL | Status: DC

## 2014-09-19 MED ORDER — KETOROLAC TROMETHAMINE 30 MG/ML IJ SOLN
30.0000 mg | Freq: Once | INTRAMUSCULAR | Status: DC
  Administered 2014-09-19: 30 mg via INTRAVENOUS
  Filled 2014-09-19: qty 1

## 2014-09-19 MED ORDER — HYDROCODONE-ACETAMINOPHEN 5-325 MG PO TABS: 2.0000 | ORAL_TABLET | ORAL | Status: DC | PRN

## 2014-09-19 NOTE — ED Notes (Signed)
Pt c/o bilat flank pain and urinary retention, and has dark urine with odor when he is able to get some out x 4 days.  Pt states that he has been drinking a lot of water but still having trouble urinating.
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
## 2014-09-19 NOTE — ED Notes (Signed)
Pt cannot urinate at this time. Will notify staff when able. Call light at bedside.

## 2014-09-19 NOTE — ED Notes (Signed)
Pt reports urinary retention since Monday; with small amounts of voiding since.

## 2014-09-19 NOTE — Discharge Instructions (Signed)
It is important for you take all of your antibiotics as prescribed even if you begin to feel better. He may follow-up with urology with the referral provided. He may take the pain medicine as needed for severe pain. Return to ED for worsening symptoms.

## 2014-09-26 ENCOUNTER — Other Ambulatory Visit: Payer: Self-pay | Admitting: Gastroenterology

## 2014-10-24 ENCOUNTER — Other Ambulatory Visit: Payer: Self-pay | Admitting: Gastroenterology

## 2014-11-19 ENCOUNTER — Other Ambulatory Visit: Payer: Self-pay | Admitting: Gastroenterology

## 2014-11-21 ENCOUNTER — Other Ambulatory Visit: Payer: Self-pay | Admitting: Gastroenterology

## 2014-11-29 ENCOUNTER — Inpatient Hospital Stay (HOSPITAL_COMMUNITY)
Admission: EM | Admit: 2014-11-29 | Discharge: 2014-12-02 | DRG: 558 | Disposition: A | Discharge status: Home / Self Care | Payer: Medicaid Other | Attending: Family Medicine | Admitting: Family Medicine

## 2014-11-29 ENCOUNTER — Encounter (HOSPITAL_COMMUNITY): Payer: Self-pay | Admitting: Emergency Medicine

## 2014-11-29 DIAGNOSIS — F419 Anxiety disorder, unspecified: Secondary | ICD-10-CM | POA: Diagnosis present

## 2014-11-29 DIAGNOSIS — N179 Acute kidney failure, unspecified: Secondary | ICD-10-CM | POA: Diagnosis present

## 2014-11-29 DIAGNOSIS — Z79899 Other long term (current) drug therapy: Secondary | ICD-10-CM

## 2014-11-29 DIAGNOSIS — Z720 Tobacco use: Secondary | ICD-10-CM | POA: Diagnosis present

## 2014-11-29 DIAGNOSIS — Z96652 Presence of left artificial knee joint: Secondary | ICD-10-CM | POA: Diagnosis present

## 2014-11-29 DIAGNOSIS — R339 Retention of urine, unspecified: Secondary | ICD-10-CM

## 2014-11-29 DIAGNOSIS — T426X5A Adverse effect of other antiepileptic and sedative-hypnotic drugs, initial encounter: Secondary | ICD-10-CM | POA: Diagnosis present

## 2014-11-29 DIAGNOSIS — I10 Essential (primary) hypertension: Secondary | ICD-10-CM | POA: Diagnosis present

## 2014-11-29 DIAGNOSIS — I959 Hypotension, unspecified: Secondary | ICD-10-CM | POA: Diagnosis present

## 2014-11-29 DIAGNOSIS — F4310 Post-traumatic stress disorder, unspecified: Secondary | ICD-10-CM | POA: Diagnosis present

## 2014-11-29 DIAGNOSIS — E871 Hypo-osmolality and hyponatremia: Secondary | ICD-10-CM | POA: Diagnosis present

## 2014-11-29 DIAGNOSIS — M6282 Rhabdomyolysis: Principal | ICD-10-CM | POA: Diagnosis present

## 2014-11-29 DIAGNOSIS — N401 Enlarged prostate with lower urinary tract symptoms: Secondary | ICD-10-CM | POA: Diagnosis present

## 2014-11-29 DIAGNOSIS — G8929 Other chronic pain: Secondary | ICD-10-CM | POA: Diagnosis present

## 2014-11-29 DIAGNOSIS — N19 Unspecified kidney failure: Secondary | ICD-10-CM

## 2014-11-29 DIAGNOSIS — F319 Bipolar disorder, unspecified: Secondary | ICD-10-CM | POA: Diagnosis present

## 2014-11-29 DIAGNOSIS — F17210 Nicotine dependence, cigarettes, uncomplicated: Secondary | ICD-10-CM | POA: Diagnosis present

## 2014-11-29 LAB — COMPREHENSIVE METABOLIC PANEL
ALK PHOS: 65 U/L (ref 39–117)
ALT: 155 U/L — ABNORMAL HIGH (ref 0–53)
AST: 905 U/L — ABNORMAL HIGH (ref 0–37)
Albumin: 4.6 g/dL (ref 3.5–5.2)
Anion gap: 13 (ref 5–15)
BUN: 46 mg/dL — AB (ref 6–23)
CHLORIDE: 94 mmol/L — AB (ref 96–112)
CO2: 25 mmol/L (ref 19–32)
Calcium: 8.5 mg/dL (ref 8.4–10.5)
Creatinine, Ser: 3.11 mg/dL — ABNORMAL HIGH (ref 0.50–1.35)
GFR calc Af Amer: 25 mL/min — ABNORMAL LOW (ref >90)
GFR calc non Af Amer: 22 mL/min — ABNORMAL LOW (ref >90)
GLUCOSE: 103 mg/dL — AB (ref 70–99)
POTASSIUM: 4.1 mmol/L (ref 3.5–5.1)
Sodium: 132 mmol/L — ABNORMAL LOW (ref 135–145)
Total Bilirubin: 1.7 mg/dL — ABNORMAL HIGH (ref 0.3–1.2)
Total Protein: 7.3 g/dL (ref 6.0–8.3)

## 2014-11-29 LAB — CBC
HCT: 43 % (ref 39.0–52.0)
HEMOGLOBIN: 14.6 g/dL (ref 13.0–17.0)
MCH: 32.2 pg (ref 26.0–34.0)
MCHC: 34 g/dL (ref 30.0–36.0)
MCV: 94.7 fL (ref 78.0–100.0)
Platelets: 212 10*3/uL (ref 150–400)
RBC: 4.54 MIL/uL (ref 4.22–5.81)
RDW: 12.7 % (ref 11.5–15.5)
WBC: 10.4 10*3/uL (ref 4.0–10.5)

## 2014-11-29 MED ORDER — DIAZEPAM 5 MG PO TABS
5.0000 mg | ORAL_TABLET | Freq: Once | ORAL | Status: AC
  Administered 2014-11-29: 5 mg via ORAL
  Filled 2014-11-29: qty 1

## 2014-11-29 MED ORDER — OXYCODONE-ACETAMINOPHEN 5-325 MG PO TABS
1.0000 | ORAL_TABLET | Freq: Once | ORAL | Status: AC
  Administered 2014-11-29: 1 via ORAL
  Filled 2014-11-29: qty 1

## 2014-11-29 NOTE — ED Notes (Signed)
Bed: WA15 Expected date:  Expected time:  Means of arrival:  Comments: 

## 2014-11-29 NOTE — ED Notes (Signed)
Pt reports bilateral lower back pain and states he has been unable to void since 8pm last night. Pt reports he urologist and was told to come to ED. Pt also reports balance complications. Pt states groin is hurting as well. Pt is alert and oriented.

## 2014-11-30 DIAGNOSIS — F319 Bipolar disorder, unspecified: Secondary | ICD-10-CM | POA: Diagnosis present

## 2014-11-30 DIAGNOSIS — E871 Hypo-osmolality and hyponatremia: Secondary | ICD-10-CM | POA: Diagnosis present

## 2014-11-30 DIAGNOSIS — G8929 Other chronic pain: Secondary | ICD-10-CM | POA: Diagnosis present

## 2014-11-30 DIAGNOSIS — N401 Enlarged prostate with lower urinary tract symptoms: Secondary | ICD-10-CM | POA: Diagnosis present

## 2014-11-30 DIAGNOSIS — M6282 Rhabdomyolysis: Principal | ICD-10-CM

## 2014-11-30 DIAGNOSIS — R339 Retention of urine, unspecified: Secondary | ICD-10-CM | POA: Diagnosis present

## 2014-11-30 DIAGNOSIS — I10 Essential (primary) hypertension: Secondary | ICD-10-CM | POA: Diagnosis not present

## 2014-11-30 DIAGNOSIS — I959 Hypotension, unspecified: Secondary | ICD-10-CM | POA: Diagnosis present

## 2014-11-30 DIAGNOSIS — Z96652 Presence of left artificial knee joint: Secondary | ICD-10-CM | POA: Diagnosis present

## 2014-11-30 DIAGNOSIS — Z79899 Other long term (current) drug therapy: Secondary | ICD-10-CM | POA: Diagnosis not present

## 2014-11-30 DIAGNOSIS — N179 Acute kidney failure, unspecified: Secondary | ICD-10-CM | POA: Diagnosis not present

## 2014-11-30 DIAGNOSIS — T426X5A Adverse effect of other antiepileptic and sedative-hypnotic drugs, initial encounter: Secondary | ICD-10-CM | POA: Diagnosis present

## 2014-11-30 DIAGNOSIS — F4310 Post-traumatic stress disorder, unspecified: Secondary | ICD-10-CM | POA: Diagnosis present

## 2014-11-30 DIAGNOSIS — F17210 Nicotine dependence, cigarettes, uncomplicated: Secondary | ICD-10-CM | POA: Diagnosis present

## 2014-11-30 DIAGNOSIS — Z720 Tobacco use: Secondary | ICD-10-CM | POA: Diagnosis present

## 2014-11-30 DIAGNOSIS — F419 Anxiety disorder, unspecified: Secondary | ICD-10-CM | POA: Diagnosis present

## 2014-11-30 LAB — CBC
HEMATOCRIT: 38.3 % — AB (ref 39.0–52.0)
HEMOGLOBIN: 12.8 g/dL — AB (ref 13.0–17.0)
MCH: 31.6 pg (ref 26.0–34.0)
MCHC: 33.4 g/dL (ref 30.0–36.0)
MCV: 94.6 fL (ref 78.0–100.0)
Platelets: 161 10*3/uL (ref 150–400)
RBC: 4.05 MIL/uL — ABNORMAL LOW (ref 4.22–5.81)
RDW: 12.7 % (ref 11.5–15.5)
WBC: 7.9 10*3/uL (ref 4.0–10.5)

## 2014-11-30 LAB — CK
CK TOTAL: 45613 U/L — AB (ref 7–232)
Total CK: >50000 U/L — ABNORMAL HIGH (ref 7–232)
Total CK: 35568 U/L — ABNORMAL HIGH (ref 7–232)

## 2014-11-30 LAB — PHOSPHORUS: Phosphorus: 4.3 mg/dL (ref 2.3–4.6)

## 2014-11-30 LAB — BASIC METABOLIC PANEL
Anion gap: 7 (ref 5–15)
BUN: 45 mg/dL — ABNORMAL HIGH (ref 6–23)
CO2: 25 mmol/L (ref 19–32)
Calcium: 7.9 mg/dL — ABNORMAL LOW (ref 8.4–10.5)
Chloride: 100 mmol/L (ref 96–112)
Creatinine, Ser: 2.11 mg/dL — ABNORMAL HIGH (ref 0.50–1.35)
GFR calc Af Amer: 41 mL/min — ABNORMAL LOW (ref >90)
GFR calc non Af Amer: 35 mL/min — ABNORMAL LOW (ref >90)
GLUCOSE: 116 mg/dL — AB (ref 70–99)
Potassium: 3.4 mmol/L — ABNORMAL LOW (ref 3.5–5.1)
SODIUM: 132 mmol/L — AB (ref 135–145)

## 2014-11-30 LAB — NA AND K (SODIUM & POTASSIUM), RAND UR
Potassium Urine: 19 mEq/L
SODIUM UR: 20 meq/L

## 2014-11-30 LAB — OSMOLALITY, URINE: OSMOLALITY UR: 345 mosm/kg — AB (ref 390–1090)

## 2014-11-30 LAB — MAGNESIUM: Magnesium: 2.2 mg/dL (ref 1.5–2.5)

## 2014-11-30 MED ORDER — HYDROMORPHONE HCL 1 MG/ML IJ SOLN
0.5000 mg | INTRAMUSCULAR | Status: DC | PRN
  Administered 2014-11-30 – 2014-12-01 (×7): 1 mg via INTRAVENOUS
  Filled 2014-11-30 (×7): qty 1

## 2014-11-30 MED ORDER — SODIUM CHLORIDE 0.9 % IV SOLN
1000.0000 mL | INTRAVENOUS | Status: DC
  Administered 2014-11-30 – 2014-12-01 (×4): 1000 mL via INTRAVENOUS

## 2014-11-30 MED ORDER — ONDANSETRON HCL 4 MG PO TABS: 4.0000 mg | ORAL_TABLET | Freq: Four times a day (QID) | ORAL | Status: DC | PRN

## 2014-11-30 MED ORDER — SODIUM CHLORIDE 0.9 % IV SOLN
INTRAVENOUS | Status: DC
  Administered 2014-11-30: 03:00:00 via INTRAVENOUS
  Administered 2014-11-30 – 2014-12-01 (×2): 125 mL/h via INTRAVENOUS

## 2014-11-30 MED ORDER — HEPARIN SODIUM (PORCINE) 5000 UNIT/ML IJ SOLN
5000.0000 [IU] | Freq: Three times a day (TID) | INTRAMUSCULAR | Status: DC
  Administered 2014-11-30 – 2014-12-02 (×7): 5000 [IU] via SUBCUTANEOUS
  Filled 2014-11-30 (×10): qty 1

## 2014-11-30 MED ORDER — NICOTINE 21 MG/24HR TD PT24
21.0000 mg | MEDICATED_PATCH | Freq: Once | TRANSDERMAL | Status: AC
  Administered 2014-11-30: 21 mg via TRANSDERMAL
  Filled 2014-11-30: qty 1

## 2014-11-30 MED ORDER — SODIUM CHLORIDE 0.9 % IV SOLN
1000.0000 mL | Freq: Once | INTRAVENOUS | Status: AC
  Administered 2014-11-30: 1000 mL via INTRAVENOUS

## 2014-11-30 MED ORDER — ALPRAZOLAM 1 MG PO TABS
1.0000 mg | ORAL_TABLET | Freq: Four times a day (QID) | ORAL | Status: DC | PRN
  Administered 2014-11-30 – 2014-12-01 (×6): 1 mg via ORAL
  Filled 2014-11-30 (×6): qty 1

## 2014-11-30 MED ORDER — SODIUM CHLORIDE 0.9 % IV SOLN: 1000.0000 mL | INTRAVENOUS | Status: DC

## 2014-11-30 MED ORDER — SODIUM CHLORIDE 0.9 % IV SOLN: 1000.0000 mL | Freq: Once | INTRAVENOUS | Status: DC

## 2014-11-30 MED ORDER — ACETAMINOPHEN 325 MG PO TABS: 650.0000 mg | ORAL_TABLET | Freq: Three times a day (TID) | ORAL | Status: DC | PRN

## 2014-11-30 MED ORDER — OXYCODONE HCL 5 MG PO TABS
5.0000 mg | ORAL_TABLET | ORAL | Status: DC | PRN
  Administered 2014-11-30 – 2014-12-01 (×8): 5 mg via ORAL
  Filled 2014-11-30 (×8): qty 1

## 2014-11-30 MED ORDER — PANTOPRAZOLE SODIUM 40 MG PO TBEC
40.0000 mg | DELAYED_RELEASE_TABLET | Freq: Every day | ORAL | Status: DC
  Administered 2014-11-30 – 2014-12-02 (×3): 40 mg via ORAL
  Filled 2014-11-30 (×4): qty 1

## 2014-11-30 MED ORDER — ACETAMINOPHEN 650 MG RE SUPP: 650.0000 mg | Freq: Four times a day (QID) | RECTAL | Status: DC | PRN

## 2014-11-30 MED ORDER — ONDANSETRON HCL 4 MG/2ML IJ SOLN: 4.0000 mg | Freq: Four times a day (QID) | INTRAMUSCULAR | Status: DC | PRN
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
## 2014-12-01 LAB — COMPREHENSIVE METABOLIC PANEL
ALT: 119 U/L — ABNORMAL HIGH (ref 0–53)
ANION GAP: 5 (ref 5–15)
AST: 512 U/L — AB (ref 0–37)
Albumin: 3.8 g/dL (ref 3.5–5.2)
Alkaline Phosphatase: 54 U/L (ref 39–117)
BUN: 16 mg/dL (ref 6–23)
CALCIUM: 8.1 mg/dL — AB (ref 8.4–10.5)
CO2: 25 mmol/L (ref 19–32)
Chloride: 105 mmol/L (ref 96–112)
Creatinine, Ser: 0.87 mg/dL (ref 0.50–1.35)
GFR calc non Af Amer: >90 mL/min (ref >90)
GLUCOSE: 115 mg/dL — AB (ref 70–99)
POTASSIUM: 4.3 mmol/L (ref 3.5–5.1)
Sodium: 135 mmol/L (ref 135–145)
Total Bilirubin: 0.5 mg/dL (ref 0.3–1.2)
Total Protein: 6.5 g/dL (ref 6.0–8.3)

## 2014-12-01 LAB — PHOSPHORUS: Phosphorus: 1.9 mg/dL — ABNORMAL LOW (ref 2.3–4.6)

## 2014-12-01 LAB — MAGNESIUM: MAGNESIUM: 1.7 mg/dL (ref 1.5–2.5)

## 2014-12-01 LAB — CK
CK TOTAL: 20394 U/L — AB (ref 7–232)
Total CK: 30027 U/L — ABNORMAL HIGH (ref 7–232)

## 2014-12-01 MED ORDER — HYDROMORPHONE HCL 1 MG/ML IJ SOLN: 0.5000 mg | INTRAMUSCULAR | Status: DC | PRN

## 2014-12-01 MED ORDER — OXYCODONE HCL 5 MG PO TABS
5.0000 mg | ORAL_TABLET | ORAL | Status: DC | PRN
  Administered 2014-12-01 – 2014-12-02 (×6): 10 mg via ORAL
  Filled 2014-12-01 (×6): qty 2

## 2014-12-01 MED ORDER — ALPRAZOLAM 1 MG PO TABS
1.0000 mg | ORAL_TABLET | Freq: Four times a day (QID) | ORAL | Status: DC | PRN
  Administered 2014-12-01 – 2014-12-02 (×5): 2 mg via ORAL
  Filled 2014-12-01 (×5): qty 2

## 2014-12-01 MED ORDER — SENNOSIDES-DOCUSATE SODIUM 8.6-50 MG PO TABS
1.0000 | ORAL_TABLET | Freq: Two times a day (BID) | ORAL | Status: DC
  Filled 2014-12-01 (×2): qty 1
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
## 2014-12-02 LAB — BASIC METABOLIC PANEL
Anion gap: 4 — ABNORMAL LOW (ref 5–15)
BUN: 8 mg/dL (ref 6–23)
CO2: 29 mmol/L (ref 19–32)
Calcium: 8.6 mg/dL (ref 8.4–10.5)
Chloride: 109 mmol/L (ref 96–112)
Creatinine, Ser: 0.74 mg/dL (ref 0.50–1.35)
Glucose, Bld: 112 mg/dL — ABNORMAL HIGH (ref 70–99)
POTASSIUM: 3.9 mmol/L (ref 3.5–5.1)
Sodium: 142 mmol/L (ref 135–145)

## 2014-12-02 LAB — CK: Total CK: 10078 U/L — ABNORMAL HIGH (ref 7–232)

## 2014-12-02 MED ORDER — HYDROCODONE-ACETAMINOPHEN 5-325 MG PO TABS: 2.0000 | ORAL_TABLET | ORAL | Status: DC | PRN
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
## 2014-12-07 ENCOUNTER — Encounter (HOSPITAL_COMMUNITY): Payer: Self-pay | Admitting: Emergency Medicine

## 2014-12-07 ENCOUNTER — Emergency Department (HOSPITAL_COMMUNITY)
Admission: EM | Admit: 2014-12-07 | Discharge: 2014-12-07 | Disposition: A | Discharge status: Home / Self Care | Payer: Medicaid Other | Attending: Emergency Medicine | Admitting: Emergency Medicine

## 2014-12-07 DIAGNOSIS — G8929 Other chronic pain: Secondary | ICD-10-CM | POA: Insufficient documentation

## 2014-12-07 DIAGNOSIS — Z8619 Personal history of other infectious and parasitic diseases: Secondary | ICD-10-CM | POA: Insufficient documentation

## 2014-12-07 DIAGNOSIS — I10 Essential (primary) hypertension: Secondary | ICD-10-CM | POA: Insufficient documentation

## 2014-12-07 DIAGNOSIS — Z87448 Personal history of other diseases of urinary system: Secondary | ICD-10-CM | POA: Diagnosis not present

## 2014-12-07 DIAGNOSIS — M79605 Pain in left leg: Secondary | ICD-10-CM | POA: Insufficient documentation

## 2014-12-07 DIAGNOSIS — Z8709 Personal history of other diseases of the respiratory system: Secondary | ICD-10-CM | POA: Diagnosis not present

## 2014-12-07 DIAGNOSIS — Z872 Personal history of diseases of the skin and subcutaneous tissue: Secondary | ICD-10-CM | POA: Insufficient documentation

## 2014-12-07 DIAGNOSIS — Z86010 Personal history of colonic polyps: Secondary | ICD-10-CM | POA: Insufficient documentation

## 2014-12-07 DIAGNOSIS — Z720 Tobacco use: Secondary | ICD-10-CM | POA: Insufficient documentation

## 2014-12-07 DIAGNOSIS — M1990 Unspecified osteoarthritis, unspecified site: Secondary | ICD-10-CM | POA: Insufficient documentation

## 2014-12-07 DIAGNOSIS — Z79899 Other long term (current) drug therapy: Secondary | ICD-10-CM | POA: Diagnosis not present

## 2014-12-07 DIAGNOSIS — M79606 Pain in leg, unspecified: Secondary | ICD-10-CM

## 2014-12-07 LAB — BASIC METABOLIC PANEL
Anion gap: 9 (ref 5–15)
BUN: 12 mg/dL (ref 6–23)
CO2: 24 mmol/L (ref 19–32)
CREATININE: 0.71 mg/dL (ref 0.50–1.35)
Calcium: 9.1 mg/dL (ref 8.4–10.5)
Chloride: 107 mmol/L (ref 96–112)
GFR calc non Af Amer: >90 mL/min (ref >90)
GLUCOSE: 123 mg/dL — AB (ref 70–99)
Potassium: 3.7 mmol/L (ref 3.5–5.1)
SODIUM: 140 mmol/L (ref 135–145)

## 2014-12-07 LAB — CBC
HCT: 44.8 % (ref 39.0–52.0)
Hemoglobin: 15.4 g/dL (ref 13.0–17.0)
MCH: 32.4 pg (ref 26.0–34.0)
MCHC: 34.4 g/dL (ref 30.0–36.0)
MCV: 94.3 fL (ref 78.0–100.0)
Platelets: 261 10*3/uL (ref 150–400)
RBC: 4.75 MIL/uL (ref 4.22–5.81)
RDW: 12.4 % (ref 11.5–15.5)
WBC: 5 10*3/uL (ref 4.0–10.5)

## 2014-12-07 LAB — CK: Total CK: 188 U/L (ref 7–232)

## 2014-12-07 MED ORDER — OXYCODONE-ACETAMINOPHEN 5-325 MG PO TABS: 1.0000 | ORAL_TABLET | Freq: Four times a day (QID) | ORAL | Status: DC | PRN

## 2014-12-07 MED ORDER — SODIUM CHLORIDE 0.9 % IV BOLUS (SEPSIS)
1000.0000 mL | Freq: Once | INTRAVENOUS | Status: AC
  Administered 2014-12-07: 1000 mL via INTRAVENOUS

## 2014-12-07 MED ORDER — OXYCODONE-ACETAMINOPHEN 5-325 MG PO TABS
2.0000 | ORAL_TABLET | Freq: Once | ORAL | Status: AC
  Administered 2014-12-07: 2 via ORAL
  Filled 2014-12-07: qty 2

## 2014-12-07 NOTE — ED Notes (Signed)
Pt ambulated to restroom with steady gait.
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
## 2014-12-07 NOTE — Discharge Instructions (Signed)

## 2014-12-07 NOTE — ED Notes (Signed)
Pt alert, oriented, and ambulatory upon DC. He was advised to follow up with PCP this week. He leaves with DC papers and prescriptions in hand.

## 2014-12-07 NOTE — ED Notes (Signed)
Pt was admitted for rhabdo and kidney failure after taking one dose of neurontin last thursday.  States that since then, he began having blisters and pain to bilateral legs.  C/o increased pain over last 3 days.  Pt states that he was having this pain when he came in the hospital last time.

## 2014-12-07 NOTE — ED Notes (Signed)
MD Walden at bedside 

## 2014-12-13 ENCOUNTER — Other Ambulatory Visit: Payer: Self-pay | Admitting: Gastroenterology

## 2015-01-02 ENCOUNTER — Ambulatory Visit (INDEPENDENT_AMBULATORY_CARE_PROVIDER_SITE_OTHER): Payer: Medicaid Other | Admitting: Gastroenterology

## 2015-01-02 ENCOUNTER — Encounter: Payer: Self-pay | Admitting: Gastroenterology

## 2015-01-02 VITALS — BP 110/60 | HR 72 | Ht 72.0 in | Wt 196.4 lb

## 2015-01-02 DIAGNOSIS — Z1211 Encounter for screening for malignant neoplasm of colon: Secondary | ICD-10-CM

## 2015-01-02 DIAGNOSIS — B1710 Acute hepatitis C without hepatic coma: Secondary | ICD-10-CM | POA: Diagnosis not present

## 2015-01-02 DIAGNOSIS — R112 Nausea with vomiting, unspecified: Secondary | ICD-10-CM | POA: Diagnosis not present

## 2015-01-02 DIAGNOSIS — K2270 Barrett's esophagus without dysplasia: Secondary | ICD-10-CM

## 2015-01-02 MED ORDER — ESOMEPRAZOLE MAGNESIUM 40 MG PO CPDR: 40.0000 mg | DELAYED_RELEASE_CAPSULE | Freq: Two times a day (BID) | ORAL | Status: AC

## 2015-01-02 MED ORDER — PROMETHAZINE HCL 25 MG PO TABS: 25.0000 mg | ORAL_TABLET | Freq: Four times a day (QID) | ORAL | Status: DC | PRN

## 2015-01-02 NOTE — Assessment & Plan Note (Signed)
Recent transaminitis suggests a clinical relapse.  He's followed in the hepatitis clinic.

## 2015-01-02 NOTE — Patient Instructions (Signed)
Follow up as needed We have renewed your phenergan today

## 2015-01-02 NOTE — Assessment & Plan Note (Signed)
Plan screening colonoscopy around his 49th birthday

## 2015-01-02 NOTE — Progress Notes (Signed)
      History of Present Illness:  Charles [name] has returned for follow-up of abdominal pain and pyrosis.   2011 endoscopy demonstrated Barrett's esophagus. Follow-up  endoscopy in 2014 was negative for Barrett's. An early peptic stricture was dilated.   Gastric emptying scan in June, 2015 was normal.  CT scan in December, 2015demonstratede thickening of the sigmoid and rectum suspicious for colitis. His main complaint is pyrosis which is well-controlled with daily Nexium.  If he goes more than 2 days without medicine he develops severe heartburn.  He denies dysphagia.  He underwent therapy for hepatitis C but apparently had a relapse.  Last month AST was 512 inhale 10/28/17.  He underwent a total knee replacement several months ago and is scheduled for replacement of his other knee .    Review of Systems: Pertinent positive and negative review of systems were noted in the above HPI section. All other review of systems were otherwise negative.    Current Medications, Allergies, Past Medical History, Past Surgical History, Family History and Social History were reviewed in [facility] record  Vital signs were reviewed in today's medical record. Physical Exam: General: Well developed , well nourished, no acute distress   See Assessment and Plan under Problem List

## 2015-01-02 NOTE — Assessment & Plan Note (Signed)
He still complains of intermittent nausea for which she takes Phenergan.  Gastric empty scan was normal.  Symptoms are mild.  Plan no further GI workup.

## 2015-01-02 NOTE — Assessment & Plan Note (Signed)
Plan follow-up endoscopy 2017

## 2015-01-03 ENCOUNTER — Other Ambulatory Visit (HOSPITAL_COMMUNITY): Payer: Self-pay

## 2015-01-21 ENCOUNTER — Encounter (HOSPITAL_COMMUNITY): Payer: Self-pay

## 2015-01-22 NOTE — Patient Instructions (Addendum)
Charles [name]  01/22/2015   Your procedure is scheduled on: 01/31/2015    Report to [facility] Main  Entrance and follow signs to               [place] at    [place].  Call this number if you have problems the morning of surgery [phone]   Remember:  Do not eat food or drink liquids :After Midnight.     Take these medicines the morning of surgery with A SIP OF WATER:  Xasnax if needed, Nexium, Proair inhaler if needed and bring                                You may not have any metal on your body including hair pins and              piercings  Do not wear jewelry,  lotions, powders or perfumes., deodorant.                           Men may shave face and neck.   Do not bring valuables to the hospital. [place].  Contacts, dentures or bridgework may not be worn into surgery.  Leave suitcase in the car. After surgery it may be brought to your room.     Marland Kitchen    Special Instructions: coughing and deep breathing exercises, leg exercises               Please read over the following fact sheets you were given: _____________________________________________________________________             [facility] - Preparing for Surgery Before surgery, you can play an important role.  Because skin is not sterile, your skin needs to be as free of germs as possible.  You can reduce the number of germs on your skin by washing with CHG (chlorahexidine gluconate) soap before surgery.  CHG is an antiseptic cleaner which kills germs and bonds with the skin to continue killing germs even after washing. Please DO NOT use if you have an allergy to CHG or antibacterial soaps.  If your skin becomes reddened/irritated stop using the CHG and inform your nurse when you arrive at Short Stay. Do not shave (including legs and underarms) for at least 48 hours prior to the first CHG shower.  You may shave your face/neck. Please  follow these instructions carefully:  1.  Shower with CHG Soap the night before surgery and the  morning of Surgery.  2.  If you choose to wash your hair, wash your hair first as usual with your  normal  shampoo.  3.  After you shampoo, rinse your hair and body thoroughly to remove the  shampoo.                           4.  Use CHG as you would any other liquid soap.  You can apply chg directly  to the skin and wash                       Gently with a scrungie or clean washcloth.  5.  Apply the CHG Soap to your body ONLY FROM THE NECK DOWN.   Do not use on face/ open                           Wound or open sores. Avoid contact with eyes, ears mouth and genitals (private parts).                       Wash face,  Genitals (private parts) with your normal soap.             6.  Wash thoroughly, paying special attention to the area where your surgery  will be performed.  7.  Thoroughly rinse your body with warm water from the neck down.  8.  DO NOT shower/wash with your normal soap after using and rinsing off  the CHG Soap.                9.  Pat yourself dry with a clean towel.            10.  Wear clean pajamas.            11.  Place clean sheets on your bed the night of your first shower and do not  sleep with pets. Day of Surgery : Do not apply any lotions/deodorants the morning of surgery.  Please wear clean clothes to the hospital/surgery center.  FAILURE TO FOLLOW THESE INSTRUCTIONS MAY RESULT IN THE CANCELLATION OF YOUR SURGERY PATIENT SIGNATURE_________________________________  NURSE SIGNATURE__________________________________  ________________________________________________________________________

## 2015-01-23 ENCOUNTER — Encounter (HOSPITAL_COMMUNITY): Payer: Self-pay

## 2015-01-23 ENCOUNTER — Encounter (HOSPITAL_COMMUNITY)
Admission: RE | Admit: 2015-01-23 | Discharge: 2015-01-23 | Disposition: A | Discharge status: Home / Self Care | Payer: Medicaid Other | Source: Ambulatory Visit | Attending: Orthopaedic Surgery | Admitting: Orthopaedic Surgery

## 2015-01-23 DIAGNOSIS — M1710 Unilateral primary osteoarthritis, unspecified knee: Secondary | ICD-10-CM | POA: Diagnosis not present

## 2015-01-23 DIAGNOSIS — Z01812 Encounter for preprocedural laboratory examination: Secondary | ICD-10-CM | POA: Insufficient documentation

## 2015-01-23 HISTORY — DX: Other injury of unspecified body region, initial encounter: T14.8XXA

## 2015-01-23 LAB — CBC
HCT: 45.3 % (ref 39.0–52.0)
Hemoglobin: 15.6 g/dL (ref 13.0–17.0)
MCH: 33 pg (ref 26.0–34.0)
MCHC: 34.4 g/dL (ref 30.0–36.0)
MCV: 95.8 fL (ref 78.0–100.0)
Platelets: 218 10*3/uL (ref 150–400)
RBC: 4.73 MIL/uL (ref 4.22–5.81)
RDW: 12.3 % (ref 11.5–15.5)
WBC: 9 10*3/uL (ref 4.0–10.5)

## 2015-01-23 LAB — COMPREHENSIVE METABOLIC PANEL
ALK PHOS: 72 U/L (ref 39–117)
ALT: 25 U/L (ref 0–53)
ANION GAP: 5 (ref 5–15)
AST: 22 U/L (ref 0–37)
Albumin: 4.6 g/dL (ref 3.5–5.2)
BILIRUBIN TOTAL: 0.8 mg/dL (ref 0.3–1.2)
BUN: 10 mg/dL (ref 6–23)
CALCIUM: 8.8 mg/dL (ref 8.4–10.5)
CHLORIDE: 108 mmol/L (ref 96–112)
CO2: 28 mmol/L (ref 19–32)
CREATININE: 0.74 mg/dL (ref 0.50–1.35)
GFR calc non Af Amer: >90 mL/min (ref >90)
Glucose, Bld: 98 mg/dL (ref 70–99)
Potassium: 4.1 mmol/L (ref 3.5–5.1)
Sodium: 141 mmol/L (ref 135–145)
Total Protein: 7.2 g/dL (ref 6.0–8.3)

## 2015-01-23 LAB — PROTIME-INR
INR: 0.95 (ref 0.00–1.49)
PROTHROMBIN TIME: 12.7 s (ref 11.6–15.2)

## 2015-01-23 LAB — APTT: aPTT: 38 seconds — ABNORMAL HIGH (ref 24–37)

## 2015-01-23 LAB — SURGICAL PCR SCREEN
MRSA, PCR: NEGATIVE
Staphylococcus aureus: POSITIVE — AB

## 2015-01-23 NOTE — Progress Notes (Signed)
EKG- 07/10/14 EPIC  CXR- 03/05/14 EPIC  LOV- DR Deatra Ina- 01/02/15 EPIC

## 2015-01-24 NOTE — Progress Notes (Signed)
PCR screen positive for Staph. Called in Mupirocin, notified pt. Pt will use as Directed. Routed results to MD. Note under "special needs."

## 2015-01-31 ENCOUNTER — Inpatient Hospital Stay (HOSPITAL_COMMUNITY)
Admission: RE | Admit: 2015-01-31 | Discharge: 2015-02-03 | DRG: 470 | Disposition: A | Discharge status: Home Health | Payer: Medicaid Other | Source: Ambulatory Visit | Attending: Orthopaedic Surgery | Admitting: Orthopaedic Surgery

## 2015-01-31 ENCOUNTER — Inpatient Hospital Stay (HOSPITAL_COMMUNITY): Payer: Medicaid Other

## 2015-01-31 ENCOUNTER — Encounter (HOSPITAL_COMMUNITY)
Admission: RE | Disposition: A | Discharge status: Home Health | Payer: Self-pay | Source: Ambulatory Visit | Attending: Orthopaedic Surgery

## 2015-01-31 ENCOUNTER — Inpatient Hospital Stay (HOSPITAL_COMMUNITY): Payer: Medicaid Other | Admitting: Anesthesiology

## 2015-01-31 ENCOUNTER — Encounter (HOSPITAL_COMMUNITY): Payer: Self-pay | Admitting: Anesthesiology

## 2015-01-31 DIAGNOSIS — K219 Gastro-esophageal reflux disease without esophagitis: Secondary | ICD-10-CM | POA: Diagnosis present

## 2015-01-31 DIAGNOSIS — F411 Generalized anxiety disorder: Secondary | ICD-10-CM | POA: Diagnosis present

## 2015-01-31 DIAGNOSIS — I10 Essential (primary) hypertension: Secondary | ICD-10-CM | POA: Diagnosis not present

## 2015-01-31 DIAGNOSIS — Z96652 Presence of left artificial knee joint: Secondary | ICD-10-CM | POA: Diagnosis present

## 2015-01-31 DIAGNOSIS — Z79899 Other long term (current) drug therapy: Secondary | ICD-10-CM | POA: Diagnosis not present

## 2015-01-31 DIAGNOSIS — F17210 Nicotine dependence, cigarettes, uncomplicated: Secondary | ICD-10-CM | POA: Diagnosis present

## 2015-01-31 DIAGNOSIS — B1920 Unspecified viral hepatitis C without hepatic coma: Secondary | ICD-10-CM | POA: Diagnosis not present

## 2015-01-31 DIAGNOSIS — M1711 Unilateral primary osteoarthritis, right knee: Principal | ICD-10-CM

## 2015-01-31 DIAGNOSIS — Z01812 Encounter for preprocedural laboratory examination: Secondary | ICD-10-CM | POA: Diagnosis not present

## 2015-01-31 DIAGNOSIS — M25561 Pain in right knee: Secondary | ICD-10-CM | POA: Diagnosis present

## 2015-01-31 DIAGNOSIS — Z96651 Presence of right artificial knee joint: Secondary | ICD-10-CM

## 2015-01-31 HISTORY — PX: TOTAL KNEE ARTHROPLASTY: SHX125

## 2015-01-31 LAB — TYPE AND SCREEN
ABO/RH(D): O POS
Antibody Screen: NEGATIVE

## 2015-01-31 SURGERY — ARTHROPLASTY, KNEE, TOTAL
Anesthesia: Spinal | Site: Knee | Laterality: Right

## 2015-01-31 MED ORDER — SODIUM CHLORIDE 0.9 % IJ SOLN
INTRAMUSCULAR | Status: AC
  Filled 2015-01-31: qty 10

## 2015-01-31 MED ORDER — ONDANSETRON HCL 4 MG/2ML IJ SOLN
INTRAMUSCULAR | Status: DC | PRN
  Administered 2015-01-31: 4 mg via INTRAVENOUS

## 2015-01-31 MED ORDER — METOCLOPRAMIDE HCL 5 MG/ML IJ SOLN: 5.0000 mg | Freq: Three times a day (TID) | INTRAMUSCULAR | Status: DC | PRN

## 2015-01-31 MED ORDER — CEFAZOLIN SODIUM 1-5 GM-% IV SOLN
1.0000 g | Freq: Four times a day (QID) | INTRAVENOUS | Status: AC
  Administered 2015-01-31 (×2): 1 g via INTRAVENOUS
  Filled 2015-01-31 (×2): qty 50

## 2015-01-31 MED ORDER — ACETAMINOPHEN 325 MG PO TABS
650.0000 mg | ORAL_TABLET | Freq: Four times a day (QID) | ORAL | Status: DC | PRN
  Administered 2015-02-01 – 2015-02-02 (×2): 650 mg via ORAL
  Filled 2015-01-31 (×2): qty 2

## 2015-01-31 MED ORDER — SODIUM CHLORIDE 0.9 % IV SOLN
10.0000 mg | INTRAVENOUS | Status: DC | PRN
  Administered 2015-01-31: 50 ug/min via INTRAVENOUS

## 2015-01-31 MED ORDER — PROPOFOL 10 MG/ML IV BOLUS
INTRAVENOUS | Status: AC
  Filled 2015-01-31: qty 20

## 2015-01-31 MED ORDER — DOCUSATE SODIUM 100 MG PO CAPS
100.0000 mg | ORAL_CAPSULE | Freq: Two times a day (BID) | ORAL | Status: DC
  Administered 2015-01-31 – 2015-02-03 (×6): 100 mg via ORAL

## 2015-01-31 MED ORDER — PHENOL 1.4 % MT LIQD: 1.0000 | OROMUCOSAL | Status: DC | PRN

## 2015-01-31 MED ORDER — HYDROMORPHONE HCL 1 MG/ML IJ SOLN: 0.2500 mg | INTRAMUSCULAR | Status: DC | PRN

## 2015-01-31 MED ORDER — POLYETHYLENE GLYCOL 3350 17 G PO PACK
17.0000 g | PACK | Freq: Every day | ORAL | Status: DC | PRN
  Administered 2015-02-02: 17 g via ORAL
  Filled 2015-01-31: qty 1

## 2015-01-31 MED ORDER — SODIUM CHLORIDE 0.9 % IR SOLN
Status: DC | PRN
  Administered 2015-01-31 (×2): 1000 mL

## 2015-01-31 MED ORDER — RIVAROXABAN 10 MG PO TABS
10.0000 mg | ORAL_TABLET | Freq: Every day | ORAL | Status: DC
  Administered 2015-02-01 – 2015-02-03 (×3): 10 mg via ORAL
  Filled 2015-01-31 (×4): qty 1

## 2015-01-31 MED ORDER — ZOLPIDEM TARTRATE 5 MG PO TABS
5.0000 mg | ORAL_TABLET | Freq: Every evening | ORAL | Status: DC | PRN
Start: 2015-01-31 — End: 2015-02-03
  Administered 2015-01-31 – 2015-02-02 (×2): 5 mg via ORAL
  Filled 2015-01-31 (×2): qty 1

## 2015-01-31 MED ORDER — METOCLOPRAMIDE HCL 10 MG PO TABS: 5.0000 mg | ORAL_TABLET | Freq: Three times a day (TID) | ORAL | Status: DC | PRN

## 2015-01-31 MED ORDER — CEFAZOLIN SODIUM-DEXTROSE 2-3 GM-% IV SOLR
INTRAVENOUS | Status: AC
  Filled 2015-01-31: qty 50

## 2015-01-31 MED ORDER — PHENYLEPHRINE HCL 10 MG/ML IJ SOLN
INTRAMUSCULAR | Status: AC
  Filled 2015-01-31: qty 1

## 2015-01-31 MED ORDER — PROPOFOL 10 MG/ML IV BOLUS
INTRAVENOUS | Status: DC | PRN
  Administered 2015-01-31: 30 mg via INTRAVENOUS

## 2015-01-31 MED ORDER — TRANEXAMIC ACID 1000 MG/10ML IV SOLN
1000.0000 mg | INTRAVENOUS | Status: AC
  Administered 2015-01-31: 1000 mg via INTRAVENOUS
  Filled 2015-01-31: qty 10

## 2015-01-31 MED ORDER — KETOROLAC TROMETHAMINE 15 MG/ML IJ SOLN
7.5000 mg | Freq: Four times a day (QID) | INTRAMUSCULAR | Status: AC
  Administered 2015-01-31 – 2015-02-01 (×4): 7.5 mg via INTRAVENOUS
  Filled 2015-01-31 (×4): qty 1

## 2015-01-31 MED ORDER — EPHEDRINE SULFATE 50 MG/ML IJ SOLN
INTRAMUSCULAR | Status: AC
  Filled 2015-01-31: qty 1

## 2015-01-31 MED ORDER — MIDAZOLAM HCL 5 MG/5ML IJ SOLN
INTRAMUSCULAR | Status: DC | PRN
  Administered 2015-01-31: 2 mg via INTRAVENOUS

## 2015-01-31 MED ORDER — MENTHOL 3 MG MT LOZG: 1.0000 | LOZENGE | OROMUCOSAL | Status: DC | PRN

## 2015-01-31 MED ORDER — ACETAMINOPHEN 10 MG/ML IV SOLN
1000.0000 mg | Freq: Once | INTRAVENOUS | Status: AC
  Administered 2015-01-31: 1000 mg via INTRAVENOUS
  Filled 2015-01-31: qty 100

## 2015-01-31 MED ORDER — SODIUM CHLORIDE 0.9 % IJ SOLN
INTRAMUSCULAR | Status: DC | PRN
  Administered 2015-01-31: 40 mL

## 2015-01-31 MED ORDER — FENTANYL CITRATE (PF) 100 MCG/2ML IJ SOLN
INTRAMUSCULAR | Status: DC | PRN
  Administered 2015-01-31: 100 ug via INTRAVENOUS

## 2015-01-31 MED ORDER — ACETAMINOPHEN 650 MG RE SUPP: 650.0000 mg | Freq: Four times a day (QID) | RECTAL | Status: DC | PRN

## 2015-01-31 MED ORDER — PANTOPRAZOLE SODIUM 40 MG PO TBEC
80.0000 mg | DELAYED_RELEASE_TABLET | Freq: Every day | ORAL | Status: DC
  Administered 2015-02-01 – 2015-02-03 (×3): 80 mg via ORAL
  Filled 2015-01-31 (×4): qty 2

## 2015-01-31 MED ORDER — HYDROMORPHONE HCL 1 MG/ML IJ SOLN
1.0000 mg | INTRAMUSCULAR | Status: DC | PRN
  Administered 2015-01-31 (×2): 1 mg via INTRAVENOUS
  Administered 2015-01-31 – 2015-02-01 (×7): 2 mg via INTRAVENOUS
  Administered 2015-02-01 – 2015-02-02 (×2): 1 mg via INTRAVENOUS
  Administered 2015-02-02 (×3): 2 mg via INTRAVENOUS
  Administered 2015-02-02: 1 mg via INTRAVENOUS
  Administered 2015-02-02 – 2015-02-03 (×3): 2 mg via INTRAVENOUS
  Administered 2015-02-03: 1 mg via INTRAVENOUS
  Filled 2015-01-31: qty 2
  Filled 2015-01-31: qty 1
  Filled 2015-01-31 (×8): qty 2
  Filled 2015-01-31: qty 1
  Filled 2015-01-31 (×2): qty 2
  Filled 2015-01-31 (×3): qty 1
  Filled 2015-01-31: qty 2
  Filled 2015-01-31: qty 1
  Filled 2015-01-31: qty 2

## 2015-01-31 MED ORDER — KETAMINE HCL 10 MG/ML IJ SOLN
INTRAMUSCULAR | Status: DC | PRN
  Administered 2015-01-31: 60 mg via INTRAVENOUS

## 2015-01-31 MED ORDER — KETAMINE HCL 10 MG/ML IJ SOLN
INTRAMUSCULAR | Status: AC
  Filled 2015-01-31: qty 1

## 2015-01-31 MED ORDER — BUPIVACAINE IN DEXTROSE 0.75-8.25 % IT SOLN
INTRATHECAL | Status: DC | PRN
  Administered 2015-01-31: 2 mL via INTRATHECAL

## 2015-01-31 MED ORDER — ALPRAZOLAM 1 MG PO TABS
1.0000 mg | ORAL_TABLET | Freq: Four times a day (QID) | ORAL | Status: DC | PRN
  Administered 2015-01-31 – 2015-02-03 (×9): 1 mg via ORAL
  Filled 2015-01-31 (×9): qty 1

## 2015-01-31 MED ORDER — OXYCODONE HCL 5 MG PO TABS
5.0000 mg | ORAL_TABLET | ORAL | Status: DC | PRN
  Administered 2015-01-31: 5 mg via ORAL
  Administered 2015-01-31: 10 mg via ORAL
  Administered 2015-01-31 – 2015-02-03 (×15): 15 mg via ORAL
  Filled 2015-01-31: qty 2
  Filled 2015-01-31 (×13): qty 3
  Filled 2015-01-31: qty 1
  Filled 2015-01-31 (×3): qty 3

## 2015-01-31 MED ORDER — ONDANSETRON HCL 4 MG/2ML IJ SOLN
4.0000 mg | Freq: Four times a day (QID) | INTRAMUSCULAR | Status: DC | PRN
  Administered 2015-01-31: 4 mg via INTRAVENOUS
  Filled 2015-01-31: qty 2

## 2015-01-31 MED ORDER — SODIUM CHLORIDE 0.9 % IJ SOLN
INTRAMUSCULAR | Status: AC
  Filled 2015-01-31: qty 50

## 2015-01-31 MED ORDER — DEXAMETHASONE SODIUM PHOSPHATE 10 MG/ML IJ SOLN
INTRAMUSCULAR | Status: DC | PRN
Start: 2015-01-31 — End: 2015-01-31
  Administered 2015-01-31: 10 mg via INTRAVENOUS

## 2015-01-31 MED ORDER — BUPIVACAINE LIPOSOME 1.3 % IJ SUSP
20.0000 mL | Freq: Once | INTRAMUSCULAR | Status: DC
  Filled 2015-01-31: qty 20

## 2015-01-31 MED ORDER — ALBUTEROL SULFATE HFA 108 (90 BASE) MCG/ACT IN AERS: 2.0000 | INHALATION_SPRAY | Freq: Four times a day (QID) | RESPIRATORY_TRACT | Status: DC | PRN

## 2015-01-31 MED ORDER — ONDANSETRON HCL 4 MG PO TABS
4.0000 mg | ORAL_TABLET | Freq: Four times a day (QID) | ORAL | Status: DC | PRN
  Administered 2015-02-01: 4 mg via ORAL
  Filled 2015-01-31: qty 1

## 2015-01-31 MED ORDER — DIPHENHYDRAMINE HCL 12.5 MG/5ML PO ELIX
12.5000 mg | ORAL_SOLUTION | ORAL | Status: DC | PRN
  Administered 2015-02-02: 25 mg via ORAL
  Filled 2015-01-31: qty 10

## 2015-01-31 MED ORDER — EPHEDRINE SULFATE 50 MG/ML IJ SOLN
INTRAMUSCULAR | Status: DC | PRN
  Administered 2015-01-31 (×2): 10 mg via INTRAVENOUS

## 2015-01-31 MED ORDER — FENTANYL CITRATE (PF) 100 MCG/2ML IJ SOLN
INTRAMUSCULAR | Status: AC
  Filled 2015-01-31: qty 2

## 2015-01-31 MED ORDER — SODIUM CHLORIDE 0.9 % IV SOLN
INTRAVENOUS | Status: DC
  Administered 2015-01-31: 23:00:00 via INTRAVENOUS

## 2015-01-31 MED ORDER — BUPIVACAINE LIPOSOME 1.3 % IJ SUSP
INTRAMUSCULAR | Status: DC | PRN
  Administered 2015-01-31: 20 mL

## 2015-01-31 MED ORDER — ONDANSETRON HCL 4 MG/2ML IJ SOLN
INTRAMUSCULAR | Status: AC
  Filled 2015-01-31: qty 2

## 2015-01-31 MED ORDER — CARISOPRODOL 350 MG PO TABS
350.0000 mg | ORAL_TABLET | Freq: Three times a day (TID) | ORAL | Status: DC | PRN
  Administered 2015-01-31 – 2015-02-03 (×8): 350 mg via ORAL
  Filled 2015-01-31 (×8): qty 1

## 2015-01-31 MED ORDER — MIDAZOLAM HCL 2 MG/2ML IJ SOLN
INTRAMUSCULAR | Status: AC
  Filled 2015-01-31: qty 2

## 2015-01-31 MED ORDER — LIDOCAINE HCL (CARDIAC) 20 MG/ML IV SOLN
INTRAVENOUS | Status: AC
  Filled 2015-01-31: qty 5

## 2015-01-31 MED ORDER — PROPOFOL INFUSION 10 MG/ML OPTIME
INTRAVENOUS | Status: DC | PRN
  Administered 2015-01-31: 140 ug/kg/min via INTRAVENOUS

## 2015-01-31 MED ORDER — DEXAMETHASONE SODIUM PHOSPHATE 10 MG/ML IJ SOLN
INTRAMUSCULAR | Status: AC
  Filled 2015-01-31: qty 1

## 2015-01-31 MED ORDER — NICOTINE 21 MG/24HR TD PT24
21.0000 mg | MEDICATED_PATCH | TRANSDERMAL | Status: DC
  Administered 2015-01-31 – 2015-02-02 (×3): 21 mg via TRANSDERMAL
  Filled 2015-01-31 (×4): qty 1

## 2015-01-31 MED ORDER — ALBUTEROL SULFATE (2.5 MG/3ML) 0.083% IN NEBU: 2.5000 mg | INHALATION_SOLUTION | RESPIRATORY_TRACT | Status: DC | PRN

## 2015-01-31 MED ORDER — CEFAZOLIN SODIUM-DEXTROSE 2-3 GM-% IV SOLR
2.0000 g | INTRAVENOUS | Status: AC
  Administered 2015-01-31: 2 g via INTRAVENOUS

## 2015-01-31 MED ORDER — LACTATED RINGERS IV SOLN
INTRAVENOUS | Status: DC | PRN
  Administered 2015-01-31 (×3): via INTRAVENOUS

## 2015-01-31 SURGICAL SUPPLY — 66 items
APL SKNCLS STERI-STRIP NONHPOA (GAUZE/BANDAGES/DRESSINGS)
BAG SPEC THK2 15X12 ZIP CLS (MISCELLANEOUS)
BAG ZIPLOCK 12X15 (MISCELLANEOUS) IMPLANT
BANDAGE ELASTIC 6 VELCRO ST LF (GAUZE/BANDAGES/DRESSINGS) ×3 IMPLANT
BANDAGE ESMARK 6X9 LF (GAUZE/BANDAGES/DRESSINGS) ×1 IMPLANT
BENZOIN TINCTURE PRP APPL 2/3 (GAUZE/BANDAGES/DRESSINGS) IMPLANT
BLADE SAG 13.0X1.37X90 (BLADE) IMPLANT
BLADE SAG 18X100X1.27 (BLADE) IMPLANT
BNDG CMPR 9X6 STRL LF SNTH (GAUZE/BANDAGES/DRESSINGS) ×1
BNDG ESMARK 6X9 LF (GAUZE/BANDAGES/DRESSINGS) ×3
BOWL SMART MIX CTS (DISPOSABLE) ×3 IMPLANT
CAPT KNEE TOTAL 3 ×2 IMPLANT
CEMENT BONE 1-PACK (Cement) ×6 IMPLANT
CLOSURE WOUND 1/2 X4 (GAUZE/BANDAGES/DRESSINGS)
CUFF TOURN SGL QUICK 34 (TOURNIQUET CUFF) ×3
CUFF TRNQT CYL 34X4X40X1 (TOURNIQUET CUFF) ×1 IMPLANT
DRAPE EXTREMITY T 121X128X90 (DRAPE) ×3 IMPLANT
DRAPE POUCH INSTRU U-SHP 10X18 (DRAPES) ×3 IMPLANT
DRAPE SHEET LG 3/4 BI-LAMINATE (DRAPES) IMPLANT
DRAPE U-SHAPE 47X51 STRL (DRAPES) ×3 IMPLANT
DRSG PAD ABDOMINAL 8X10 ST (GAUZE/BANDAGES/DRESSINGS) ×1 IMPLANT
DURAPREP 26ML APPLICATOR (WOUND CARE) ×3 IMPLANT
ELECT REM PT RETURN 9FT ADLT (ELECTROSURGICAL) ×3
ELECTRODE REM PT RTRN 9FT ADLT (ELECTROSURGICAL) ×1 IMPLANT
FACESHIELD WRAPAROUND (MASK) ×15 IMPLANT
FACESHIELD WRAPAROUND OR TEAM (MASK) ×5 IMPLANT
GAUZE SPONGE 4X4 12PLY STRL (GAUZE/BANDAGES/DRESSINGS) ×3 IMPLANT
GAUZE XEROFORM 1X8 LF (GAUZE/BANDAGES/DRESSINGS) IMPLANT
GLOVE BIO SURGEON STRL SZ7.5 (GLOVE) ×3 IMPLANT
GLOVE BIOGEL PI IND STRL 8 (GLOVE) ×2 IMPLANT
GLOVE BIOGEL PI INDICATOR 8 (GLOVE) ×4
GLOVE ECLIPSE 8.0 STRL XLNG CF (GLOVE) ×3 IMPLANT
GOWN STRL REUS W/TWL XL LVL3 (GOWN DISPOSABLE) ×6 IMPLANT
HANDPIECE INTERPULSE COAX TIP (DISPOSABLE) ×3
IMMOBILIZER KNEE 20 (SOFTGOODS) ×2 IMPLANT
IMMOBILIZER KNEE 20 THIGH 36 (SOFTGOODS) ×1 IMPLANT
KIT BASIN OR (CUSTOM PROCEDURE TRAY) ×3 IMPLANT
NDL SAFETY ECLIPSE 18X1.5 (NEEDLE) IMPLANT
NEEDLE HYPO 18GX1.5 SHARP (NEEDLE) ×3
NS IRRIG 1000ML POUR BTL (IV SOLUTION) ×3 IMPLANT
PACK TOTAL JOINT (CUSTOM PROCEDURE TRAY) ×3 IMPLANT
PAD ABD 8X10 STRL (GAUZE/BANDAGES/DRESSINGS) ×2 IMPLANT
PADDING CAST COTTON 6X4 STRL (CAST SUPPLIES) ×4 IMPLANT
PEN SKIN MARKING BROAD (MISCELLANEOUS) ×3 IMPLANT
POSITIONER SURGICAL ARM (MISCELLANEOUS) ×3 IMPLANT
SET HNDPC FAN SPRY TIP SCT (DISPOSABLE) ×1 IMPLANT
SET PAD KNEE POSITIONER (MISCELLANEOUS) ×3 IMPLANT
SPONGE LAP 18X18 X RAY DECT (DISPOSABLE) ×2 IMPLANT
STAPLER VISISTAT 35W (STAPLE) IMPLANT
STRIP CLOSURE SKIN 1/2X4 (GAUZE/BANDAGES/DRESSINGS) IMPLANT
SUCTION FRAZIER 12FR DISP (SUCTIONS) ×3 IMPLANT
SUT MNCRL AB 4-0 PS2 18 (SUTURE) ×2 IMPLANT
SUT VIC AB 0 CT1 27 (SUTURE) ×3
SUT VIC AB 0 CT1 27XBRD ANTBC (SUTURE) ×1 IMPLANT
SUT VIC AB 1 CT1 27 (SUTURE) ×6
SUT VIC AB 1 CT1 27XBRD ANTBC (SUTURE) ×2 IMPLANT
SUT VIC AB 2-0 CT1 27 (SUTURE) ×6
SUT VIC AB 2-0 CT1 TAPERPNT 27 (SUTURE) ×2 IMPLANT
SYRINGE 60CC LL (MISCELLANEOUS) ×2 IMPLANT
TOWEL OR 17X26 10 PK STRL BLUE (TOWEL DISPOSABLE) ×3 IMPLANT
TOWEL OR NON WOVEN STRL DISP B (DISPOSABLE) IMPLANT
TRAY FOLEY CATH 16FRSI W/METER (SET/KITS/TRAYS/PACK) ×2 IMPLANT
TRAY FOLEY W/METER SILVER 14FR (SET/KITS/TRAYS/PACK) ×1 IMPLANT
WATER STERILE IRR 1500ML POUR (IV SOLUTION) ×3 IMPLANT
WRAP KNEE MAXI GEL POST OP (GAUZE/BANDAGES/DRESSINGS) ×3 IMPLANT
YANKAUER SUCT BULB TIP 10FT TU (MISCELLANEOUS) ×3 IMPLANT

## 2015-01-31 NOTE — Anesthesia Procedure Notes (Signed)
Spinal Patient location during procedure: OR Start time: 01/31/2015 7:20 AM End time: 01/31/2015 7:26 AM Staffing Resident/CRNA: ,  L Performed by: resident/CRNA  Preanesthetic Checklist Completed: patient identified, site marked, surgical consent, pre-op evaluation, timeout performed, IV checked, risks and benefits discussed and monitors and equipment checked Spinal Block Patient position: sitting Prep: Betadine Patient monitoring: heart rate, continuous pulse ox and blood pressure Location: L3-4 Injection technique: single-shot Needle Needle type: Sprotte  Needle gauge: 24 G Needle length: 9 cm Assessment Sensory level: T6 Additional Notes Kit expiration 06/2016 and lot # [id number] CSF clear, negative heme, negative paresthesia Returned to supine and tolerated well   

## 2015-01-31 NOTE — Brief Op Note (Signed)
01/31/2015  9:12 AM  PATIENT:  Charles [name]  49 y.o. male  PRE-OPERATIVE DIAGNOSIS:  Right knee arthritis  POST-OPERATIVE DIAGNOSIS:  Right knee arthritis  PROCEDURE:  Procedure(s): RIGHT TOTAL KNEE ARTHROPLASTY (Right)  SURGEON:  Surgeon(s) and Role:    * Mcarthur Rossetti, MD - Primary  PHYSICIAN ASSISTANT: Benita Stabile, PA-C  ANESTHESIA:   local and spinal  EBL:  Total I/O In: 1000 [I.V.:1000] Out: 325 [Urine:250; Blood:75]  BLOOD ADMINISTERED:none  DRAINS: none   LOCAL MEDICATIONS USED:  OTHER Experil  SPECIMEN:  No Specimen  DISPOSITION OF SPECIMEN:  N/A  COUNTS:  YES  TOURNIQUET:   Total Tourniquet Time Documented: Thigh (Right) - 55 minutes Total: Thigh (Right) - 55 minutes   DICTATION: .Other Dictation: Dictation Number [id number]  PLAN OF CARE: Admit to inpatient   PATIENT DISPOSITION:  PACU - hemodynamically stable.   Delay start of Pharmacological VTE agent (>24hrs) due to surgical blood loss or risk of bleeding: no

## 2015-01-31 NOTE — Progress Notes (Signed)
Utilization review completed.  

## 2015-01-31 NOTE — Anesthesia Postprocedure Evaluation (Signed)
  Anesthesia Post-op Note  Patient: Charles [name]  Procedure(s) Performed: Procedure(s): RIGHT TOTAL KNEE ARTHROPLASTY (Right)  Patient Location: PACU  Anesthesia Type:Spinal  Level of Consciousness: awake  Airway and Oxygen Therapy: Patient Spontanous Breathing  Post-op Pain: none  Post-op Assessment: Post-op Vital signs reviewed  Post-op Vital Signs: Reviewed  Last Vitals:  Filed Vitals:   01/31/15 1220  BP: 110/62  Pulse: 60  Temp: 36.4 C  Resp: 12    Complications: No apparent anesthesia complications

## 2015-01-31 NOTE — Transfer of Care (Signed)
Immediate Anesthesia Transfer of Care Note  Patient: Charles [name]  Procedure(s) Performed: Procedure(s): RIGHT TOTAL KNEE ARTHROPLASTY (Right)  Patient Location: PACU  Anesthesia Type:Spinal  Level of Consciousness: awake, alert  and oriented  Airway & Oxygen Therapy: Patient Spontanous Breathing and Patient connected to face mask oxygen  Post-op Assessment: Report given to RN and Post -op Vital signs reviewed and stable  Post vital signs: Reviewed and stable  Last Vitals:  Filed Vitals:   01/31/15 0521  BP: 107/63  Pulse: 93  Temp: 37.1 C  Resp: 16    Complications: No apparent anesthesia complications

## 2015-01-31 NOTE — Anesthesia Preprocedure Evaluation (Addendum)
Anesthesia Evaluation  Patient identified by MRN, date of birth, ID band Patient awake    Reviewed: Allergy & Precautions, NPO status , Patient's Chart, lab work & pertinent test results  Airway Mallampati: II  TM Distance: >3 FB Neck ROM: Full    Dental   Pulmonary Current Smoker,  breath sounds clear to auscultation        Cardiovascular hypertension, Rhythm:Regular Rate:Normal     Neuro/Psych    GI/Hepatic GERD-  ,(+) Hepatitis -  Endo/Other  negative endocrine ROS  Renal/GU Renal disease     Musculoskeletal   Abdominal   Peds  Hematology   Anesthesia Other Findings   Reproductive/Obstetrics                            Anesthesia Physical Anesthesia Plan  ASA: III  Anesthesia Plan: Spinal   Post-op Pain Management:    Induction: Intravenous  Airway Management Planned: Nasal Cannula  Additional Equipment:   Intra-op Plan:   Post-operative Plan:   Informed Consent: I have reviewed the patients History and Physical, chart, labs and discussed the procedure including the risks, benefits and alternatives for the proposed anesthesia with the patient or authorized representative who has indicated his/her understanding and acceptance.   Dental advisory given  Plan Discussed with: CRNA and Anesthesiologist  Anesthesia Plan Comments:        Anesthesia Quick Evaluation
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
## 2015-01-31 NOTE — Evaluation (Signed)
Physical Therapy Evaluation Patient Details Name: Charles [name] MRN: [id number] DOB: 1966-01-31 Today's Date: 01/31/2015   History of Present Illness  R TKR; s/p L TKR 1 yr ago  Clinical Impression  Pt s/p R TKR presents with decreased R LE strength/ROM and post op pain limiting functional mobility.  Pt should progress to dc home with family assist and HHPT follow up.    Follow Up Recommendations Home health PT    Equipment Recommendations  Rolling walker with 5" wheels    Recommendations for Other Services OT consult     Precautions / Restrictions Precautions Precautions: Knee;Fall Required Braces or Orthoses: Knee Immobilizer - Right Knee Immobilizer - Right: Discontinue once straight leg raise with < 10 degree lag Restrictions Weight Bearing Restrictions: No Other Position/Activity Restrictions: WBAT      Mobility  Bed Mobility Overal bed mobility: Needs Assistance Bed Mobility: Supine to Sit;Sit to Supine     Supine to sit: Min assist Sit to supine: Min assist   General bed mobility comments: cues for sequence and use of L LE to self assist  Transfers Overall transfer level: Needs assistance Equipment used: Rolling walker (2 wheeled) Transfers: Sit to/from Stand Sit to Stand: Min assist         General transfer comment: cues for LE management and use of UEs to self assist  Ambulation/Gait Ambulation/Gait assistance: Min assist Ambulation Distance (Feet): 58 Feet Assistive device: Rolling walker (2 wheeled) Gait Pattern/deviations: Step-to pattern;Decreased step length - right;Decreased step length - left;Shuffle;Trunk flexed     General Gait Details: cues for posture, position from RW and sequence  Stairs            Wheelchair Mobility    Modified Rankin (Stroke Patients Only)       Balance                                             Pertinent Vitals/Pain Pain Assessment: 0-10 Pain Score: 6  Pain Location: R  knee Pain Descriptors / Indicators: Aching;Sore Pain Intervention(s): Limited activity within patient's tolerance;Monitored during session;Premedicated before session;Ice applied    Home Living Family/patient expects to be discharged to:: Private residence Living Arrangements: Parent Available Help at Discharge: Family Type of Home: House Home Access: Stairs to enter Entrance Stairs-Rails: Right Entrance Stairs-Number of Steps: 3 Home Layout: One level Home Equipment: None Additional Comments: Pt states he donated all his equipment after last surgery    Prior Function Level of Independence: Independent               Hand Dominance        Extremity/Trunk Assessment   Upper Extremity Assessment: Overall WFL for tasks assessed           Lower Extremity Assessment: RLE deficits/detail      Cervical / Trunk Assessment: Normal  Communication   Communication: No difficulties  Cognition Arousal/Alertness: Awake/alert Behavior During Therapy: WFL for tasks assessed/performed Overall Cognitive Status: Within Functional Limits for tasks assessed                      General Comments      Exercises Total Joint Exercises Ankle Circles/Pumps: AROM;Both;15 reps;Supine      Assessment/Plan    PT Assessment Patient needs continued PT services  PT Diagnosis Difficulty walking   PT Problem List Decreased strength;Decreased range  of motion;Decreased activity tolerance;Decreased mobility;Decreased knowledge of use of DME;Pain  PT Treatment Interventions DME instruction;Gait training;Stair training;Functional mobility training;Therapeutic activities;Therapeutic exercise;Patient/family education   PT Goals (Current goals can be found in the Care Plan section) Acute Rehab PT Goals Patient Stated Goal: Resume previous active lifestyle with decreased pain PT Goal Formulation: With patient Time For Goal Achievement: 02/05/15 Potential to Achieve Goals: Good     Frequency 7X/week   Barriers to discharge        Co-evaluation               End of Session Equipment Utilized During Treatment: Gait belt;Right knee immobilizer Activity Tolerance: Patient tolerated treatment well Patient left: in bed;with call bell/phone within reach;with family/visitor present Nurse Communication: Mobility status         Time: 9892-1194 PT Time Calculation (min) (ACUTE ONLY): 28 min   Charges:   PT Evaluation $Initial PT Evaluation Tier I: 1 Procedure PT Treatments $Gait Training: 8-22 mins   PT G Codes:        Charles [name] 2015-02-16, 6:52 PM

## 2015-02-01 LAB — CBC
HCT: 35.8 % — ABNORMAL LOW (ref 39.0–52.0)
Hemoglobin: 11.6 g/dL — ABNORMAL LOW (ref 13.0–17.0)
MCH: 31.1 pg (ref 26.0–34.0)
MCHC: 32.4 g/dL (ref 30.0–36.0)
MCV: 96 fL (ref 78.0–100.0)
PLATELETS: 169 10*3/uL (ref 150–400)
RBC: 3.73 MIL/uL — ABNORMAL LOW (ref 4.22–5.81)
RDW: 12 % (ref 11.5–15.5)
WBC: 12.1 10*3/uL — ABNORMAL HIGH (ref 4.0–10.5)

## 2015-02-01 LAB — BASIC METABOLIC PANEL
ANION GAP: 10 (ref 5–15)
BUN: 23 mg/dL — ABNORMAL HIGH (ref 6–20)
CALCIUM: 8.4 mg/dL — AB (ref 8.9–10.3)
CO2: 27 mmol/L (ref 22–32)
Chloride: 100 mmol/L — ABNORMAL LOW (ref 101–111)
Creatinine, Ser: 0.86 mg/dL (ref 0.61–1.24)
Glucose, Bld: 120 mg/dL — ABNORMAL HIGH (ref 70–99)
Potassium: 4.6 mmol/L (ref 3.5–5.1)
Sodium: 137 mmol/L (ref 135–145)

## 2015-02-01 NOTE — Plan of Care (Signed)
Problem: Consults Goal: Diagnosis- Total Joint Replacement Outcome: Completed/Met Date Met:  02/01/15 Primary Total Knee RIGHT  Problem: Phase III Progression Outcomes Goal: Anticoagulant follow-up in place Outcome: Not Applicable Date Met:  93/79/02 Xarelto VTE, no f/u needed.

## 2015-02-01 NOTE — Discharge Instructions (Signed)
INSTRUCTIONS AFTER JOINT REPLACEMENT  ° °o Remove items at home which could result in a fall. This includes throw rugs or furniture in walking pathways °o ICE to the affected joint every three hours while awake for 30 minutes at a time, for at least the first 3-5 days, and then as needed for pain and swelling.  Continue to use ice for pain and swelling. You may notice swelling that will progress down to the foot and ankle.  This is normal after surgery.  Elevate your leg when you are not up walking on it.   °o Continue to use the breathing machine you got in the hospital (incentive spirometer) which will help keep your temperature down.  It is common for your temperature to cycle up and down following surgery, especially at night when you are not up moving around and exerting yourself.  The breathing machine keeps your lungs expanded and your temperature down. ° ° °DIET:  As you were doing prior to hospitalization, we recommend a well-balanced diet. ° °DRESSING / WOUND CARE / SHOWERING ° °Keep the surgical dressing until follow up.  The dressing is water proof, so you can shower without any extra covering.  IF THE DRESSING FALLS OFF or the wound gets wet inside, change the dressing with sterile gauze.  Please use good hand washing techniques before changing the dressing.  Do not use any lotions or creams on the incision until instructed by your surgeon.   ° °ACTIVITY ° °o Increase activity slowly as tolerated, but follow the weight bearing instructions below.   °o No driving for 6 weeks or until further direction given by your physician.  You cannot drive while taking narcotics.  °o No lifting or carrying greater than 10 lbs. until further directed by your surgeon. °o Avoid periods of inactivity such as sitting longer than an hour when not asleep. This helps prevent blood clots.  °o You may return to work once you are authorized by your doctor.  ° ° ° °WEIGHT BEARING  ° °Weight bearing as tolerated with assist  device (walker, cane, etc) as directed, use it as long as suggested by your surgeon or therapist, typically at least 4-6 weeks. ° ° °EXERCISES ° °Results after joint replacement surgery are often greatly improved when you follow the exercise, range of motion and muscle strengthening exercises prescribed by your doctor. Safety measures are also important to protect the joint from further injury. Any time any of these exercises cause you to have increased pain or swelling, decrease what you are doing until you are comfortable again and then slowly increase them. If you have problems or questions, call your caregiver or physical therapist for advice.  ° °Rehabilitation is important following a joint replacement. After just a few days of immobilization, the muscles of the leg can become weakened and shrink (atrophy).  These exercises are designed to build up the tone and strength of the thigh and leg muscles and to improve motion. Often times heat used for twenty to thirty minutes before working out will loosen up your tissues and help with improving the range of motion but do not use heat for the first two weeks following surgery (sometimes heat can increase post-operative swelling).  ° °These exercises can be done on a training (exercise) mat, on the floor, on a table or on a bed. Use whatever works the best and is most comfortable for you.    Use music or television while you are exercising so that   the exercises are a pleasant break in your day. This will make your life better with the exercises acting as a break in your routine that you can look forward to.   Perform all exercises about fifteen times, three times per day or as directed.  You should exercise both the operative leg and the other leg as well. ° °Exercises include: °  °• Quad Sets - Tighten up the muscle on the front of the thigh (Quad) and hold for 5-10 seconds.   °• Straight Leg Raises - With your knee straight (if you were given a brace, keep it on),  lift the leg to 60 degrees, hold for 3 seconds, and slowly lower the leg.  Perform this exercise against resistance later as your leg gets stronger.  °• Leg Slides: Lying on your back, slowly slide your foot toward your buttocks, bending your knee up off the floor (only go as far as is comfortable). Then slowly slide your foot back down until your leg is flat on the floor again.  °• Angel Wings: Lying on your back spread your legs to the side as far apart as you can without causing discomfort.  °• Hamstring Strength:  Lying on your back, push your heel against the floor with your leg straight by tightening up the muscles of your buttocks.  Repeat, but this time bend your knee to a comfortable angle, and push your heel against the floor.  You may put a pillow under the heel to make it more comfortable if necessary.  ° °A rehabilitation program following joint replacement surgery can speed recovery and prevent re-injury in the future due to weakened muscles. Contact your doctor or a physical therapist for more information on knee rehabilitation.  ° ° °CONSTIPATION ° °Constipation is defined medically as fewer than three stools per week and severe constipation as less than one stool per week.  Even if you have a regular bowel pattern at home, your normal regimen is likely to be disrupted due to multiple reasons following surgery.  Combination of anesthesia, postoperative narcotics, change in appetite and fluid intake all can affect your bowels.  ° °YOU MUST use at least one of the following options; they are listed in order of increasing strength to get the job done.  They are all available over the counter, and you may need to use some, POSSIBLY even all of these options:   ° °Drink plenty of fluids (prune juice may be helpful) and high fiber foods °Colace 100 mg by mouth twice a day  °Senokot for constipation as directed and as needed Dulcolax (bisacodyl), take with full glass of water  °Miralax (polyethylene glycol)  once or twice a day as needed. ° °If you have tried all these things and are unable to have a bowel movement in the first 3-4 days after surgery call either your surgeon or your primary doctor.   ° °If you experience loose stools or diarrhea, hold the medications until you stool forms back up.  If your symptoms do not get better within 1 week or if they get worse, check with your doctor.  If you experience "the worst abdominal pain ever" or develop nausea or vomiting, please contact the office immediately for further recommendations for treatment. ° ° °ITCHING:  If you experience itching with your medications, try taking only a single pain pill, or even half a pain pill at a time.  You can also use Benadryl over the counter for itching or also to   help with sleep.   TED HOSE STOCKINGS:  Use stockings on both legs until for at least 2 weeks or as directed by physician office. They may be removed at night for sleeping.  MEDICATIONS:  See your medication summary on the After Visit Summary that nursing will review with you.  You may have some home medications which will be placed on hold until you complete the course of blood thinner medication.  It is important for you to complete the blood thinner medication as prescribed.  PRECAUTIONS:  If you experience chest pain or shortness of breath - call 911 immediately for transfer to the hospital emergency department.   If you develop a fever greater that 101 F, purulent drainage from wound, increased redness or drainage from wound, foul odor from the wound/dressing, or calf pain - CONTACT YOUR SURGEON.                                                   FOLLOW-UP APPOINTMENTS:  If you do not already have a post-op appointment, please call the office for an appointment to be seen by your surgeon.  Guidelines for how soon to be seen are listed in your After Visit Summary, but are typically between 1-4 weeks after surgery.  OTHER INSTRUCTIONS:   Knee  Replacement:  Do not place pillow under knee, focus on keeping the knee straight while resting. CPM instructions: 0-90 degrees, 2 hours in the morning, 2 hours in the afternoon, and 2 hours in the evening. Place foam block, curve side up under heel at all times except when in CPM or when walking.  DO NOT modify, tear, cut, or change the foam block in any way.  MAKE SURE YOU:   Understand these instructions.   Get help right away if you are not doing well or get worse.    Thank you for letting us be a part of your medical care team.  It is a privilege we respect greatly.  We hope these instructions will help you stay on track for a fast and full recovery!    Information on my medicine - XARELTO (Rivaroxaban)  This medication education was reviewed with me or my healthcare representative as part of my discharge preparation.  The pharmacist that spoke with me during my hospital stay was:  Luiz Ochoa [facility]  Why was Xarelto prescribed for you? Xarelto was prescribed for you to reduce the risk of blood clots forming after orthopedic surgery. The medical term for these abnormal blood clots is venous thromboembolism (VTE).  What do you need to know about xarelto ? Take your Xarelto ONCE DAILY at the same time every day. You may take it either with or without food.  If you have difficulty swallowing the tablet whole, you may crush it and mix in applesauce just prior to taking your dose.  Take Xarelto exactly as prescribed by your doctor and DO NOT stop taking Xarelto without talking to the doctor who prescribed the medication.  Stopping without other VTE prevention medication to take the place of Xarelto may increase your risk of developing a clot.  After discharge, you should have regular check-up appointments with your healthcare provider that is prescribing your Xarelto.    What do you do if you miss a dose? If you miss a dose, take it as soon as  you remember on the same day then  continue your regularly scheduled once daily regimen the next day. Do not take two doses of Xarelto on the same day.   Important Safety Information A possible side effect of Xarelto is bleeding. You should call your healthcare provider right away if you experience any of the following: ? Bleeding from an injury or your nose that does not stop. ? Unusual colored urine (red or dark brown) or unusual colored stools (red or black). ? Unusual bruising for unknown reasons. ? A serious fall or if you hit your head (even if there is no bleeding).  Some medicines may interact with Xarelto and might increase your risk of bleeding while on Xarelto. To help avoid this, consult your healthcare provider or pharmacist prior to using any new prescription or non-prescription medications, including herbals, vitamins, non-steroidal anti-inflammatory drugs (NSAIDs) and supplements.  This website has more information on Xarelto: [email or web address]

## 2015-02-01 NOTE — Op Note (Signed)
NAMEZUBAIR, LOFTON NO.:  [id number]  MEDICAL RECORD NO.:  [id number]  LOCATION:  1448                         FACILITY:  [facility]  PHYSICIAN:  Lind Guest. Ninfa Linden, M.D.DATE OF BIRTH:  18-Jan-1966  DATE OF PROCEDURE:  01/31/2015 DATE OF DISCHARGE:                              OPERATIVE REPORT   PREOPERATIVE DIAGNOSIS:  Primary osteoarthritis and degenerative joint disease, right knee.  POSTOPERATIVE DIAGNOSIS:  Primary osteoarthritis and degenerative joint disease, right knee.  PROCEDURE:  Right total knee arthroplasty.  IMPLANTS:  Stryker Triathlon knee with size 5 femur, size 6 tibial tray, 9 mm fix bearing polyethylene insert, size 35 patella button.  SURGEON:  Lind Guest. Ninfa Linden, MD  ASSISTANT:  Erskine Emery, PA-C  ANESTHESIA: 1. Spinal. 2. Local with Exparel intraarticular capsular injection.  ANTIBIOTICS:  2 g IV Ancef.  TOURNIQUET TIME:  Less than 1 hour.  BLOOD LOSS:  Less than 200 mL.  COMPLICATIONS:  None.  INDICATIONS:  Charles [name] is a 49 year old gentleman with hepatitis C who is well known to me.  He has significant arthritis in both of his knees and underwent last year successful left total knee arthroplasty. Due to the continued pain in his right knee, his decreased mobility, his decreased quality of life, and his x-ray findings showing significant tricompartmental arthritis, he does wish to proceed with a total knee arthroplasty on the right side.  He understands the risk of acute blood loss, anemia and nerve and vessel injury, fracture, infection, and DVT. He understands the goals for decreased pain, improved mobility and overall improved quality of life.  He understands his biggest risk infection given his immunocompromised state with hepatitis C.  Again, we have had success was his left total knee arthroplasty last year.  PROCEDURE DESCRIPTION:  After informed consent was obtained, appropriate right knee was  marked.  He was brought to the operating room and spinal anesthesia was obtained while he was on the operating table.  The knee was laid in a supine position.  A nonsterile tourniquet was placed on his upper right thigh and his right leg was prepped and draped with DuraPrep and sterile drapes.  A time-out was called.  He was identified as correct patient, correct right knee.  We then used an Esmarch to wrap out the leg and tourniquet was inflated to 300 mm of pressure.  We then made a direct midline incision over the patella and carried this proximally and distally.  We dissected down the knee joint and performed a medial parapatellar arthrotomy finding a significant effusion in his knee as well as synovitis.  With the knee in a flexed position, I found complete loss of cartilage with varus deformity on the medial aspect of his knee.  I did release tissue medially and be able to improve his varus.  We then set the extramedullary tibial cutting guide with the knee in a flexed position based of taking 9 mm off the high side, correction of the varus, valgus and neutral slope.  We then made this cut without difficulty.  We then went to the femur and using intramedullary guide through a drill hole and a notch, we set our distal femoral cutting guide at  5 degrees externally rotated after taking 8 to 8.5 mm distal femoral cut.  We then made this cut without difficulty and brought the leg back down in full extension and removed the remnants of the medial and lateral meniscus and as well as ACL and PCL and then we were able to place a 9 mm extension block and his knee was full extension. We then went back to the femur, put our femoral sizing guide and set on this based off the epicondylar axis and Whitesides line, we chose a size 5 femur and this actually correlated with his other knee. We then placed our 4-in-1 cutting block for size 5 femur, made our anterior posterior cuts followed by our  chamfer cuts.  We then made our femoral box cut.  Attention was then turned to the tibia with the rotation set based of the tibial tubercle in the femoral component.  We placed a size 6 tibial tray, which also corresponded on his other side. We then made our keel punch off this with a size 6 tibial tray and placed with a size 5 femur, we trialed a 9 mm polyethylene insert.  I was pleased with this flexion, extension, instability.  We then made our patellar cut and drilled 3 holes for a size 35 patellar button.  With all trial components in place, again I was pleased with stability.  We then removed all trial components and irrigated the knee with normal saline solution using pulsatile lavage.  I then inserted a mixture of 20 mL of Exparel and 40 mL of saline throughout the joint capsule.  We then mixed our cement and cemented the real Stryker Triathlon tibial tray, size 6 followed by the real size 5 femur.  We placed the real 9 mm fix bearing polyethylene insert and cemented the patellar button.  We removed the cement debris and when the cement had hardened, we let the tourniquet down and hemostasis was obtained with electrocautery.  We then irrigated the knee again with normal saline solution and closed arthrotomy with interrupted #1 Vicryl suture followed by 0 Vicryl in the deep tissue, 2-0 Vicryl in subcutaneous tissue, 4-0 Monocryl subcuticular stitch, and Steri-Strips on the skin.  Well-padded sterile dressing was applied.  He was taken to the recovery room in stable condition.  Of note, Erskine Emery, PA-C, assisted the entire case and his assistance was crucial facilitating all aspects of this case.     Lind Guest. Ninfa Linden, M.D.     CYB/MEDQ  D:  01/31/2015  T:  02/01/2015  Job:  [id number]

## 2015-02-01 NOTE — Progress Notes (Signed)
Physical Therapy Treatment Patient Details Name: Charles [name] MRN: [id number] DOB: May 10, 1966 Today's Date: 02/01/2015    History of Present Illness R TKR; s/p L TKR 1 yr ago    PT Comments    Pt ltd by elevated pain and anxiety levels.  Assist back to bed and RN alerted and attending to pt.  Follow Up Recommendations  Home health PT     Equipment Recommendations  Rolling walker with 5" wheels    Recommendations for Other Services OT consult     Precautions / Restrictions Precautions Precautions: Knee;Fall Required Braces or Orthoses: Knee Immobilizer - Right Knee Immobilizer - Right: Discontinue once straight leg raise with < 10 degree lag Restrictions Weight Bearing Restrictions: No Other Position/Activity Restrictions: WBAT    Mobility  Bed Mobility Overal bed mobility: Needs Assistance Bed Mobility: Sit to Supine       Sit to supine: Min assist   General bed mobility comments: cues for sequence and use of L LE to self assist  Transfers Overall transfer level: Needs assistance Equipment used: Rolling walker (2 wheeled) Transfers: Sit to/from Stand Sit to Stand: Min assist;Mod assist         General transfer comment: cues for LE management and use of UEs to self assist  Ambulation/Gait Ambulation/Gait assistance: Min assist;Mod assist Ambulation Distance (Feet): 6 Feet Assistive device: Rolling walker (2 wheeled) Gait Pattern/deviations: Step-to pattern;Decreased step length - right;Decreased step length - left;Shuffle;Trunk flexed Gait velocity: decr   General Gait Details: cues for posture, position from RW and sequence   Stairs            Wheelchair Mobility    Modified Rankin (Stroke Patients Only)       Balance                                    Cognition Arousal/Alertness: Awake/alert Behavior During Therapy: WFL for tasks assessed/performed Overall Cognitive Status: Within Functional Limits for tasks  assessed                      Exercises      General Comments        Pertinent Vitals/Pain Pain Assessment: 0-10 Pain Score: 10-Worst pain ever Pain Location: R knee Pain Descriptors / Indicators: Sore;Moaning;Crying Pain Intervention(s): Limited activity within patient's tolerance;Monitored during session;Premedicated before session;Patient requesting pain meds-RN notified;Ice applied    Home Living                      Prior Function            PT Goals (current goals can now be found in the care plan section) Acute Rehab PT Goals Patient Stated Goal: Resume previous active lifestyle with decreased pain PT Goal Formulation: With patient Time For Goal Achievement: 02/05/15 Potential to Achieve Goals: Good Progress towards PT goals: Progressing toward goals    Frequency  7X/week    PT Plan Current plan remains appropriate    Co-evaluation             End of Session Equipment Utilized During Treatment: Gait belt;Right knee immobilizer Activity Tolerance: Patient tolerated treatment well Patient left: in bed;with call bell/phone within reach;with nursing/sitter in room     Time: 1335-1356 PT Time Calculation (min) (ACUTE ONLY): 21 min  Charges:  $Therapeutic Activity: 8-22 mins  G Codes:      Dezi Schaner Feb 24, 2015, 5:16 PM

## 2015-02-01 NOTE — Progress Notes (Signed)
   Subjective:  Patient reports pain as moderate.    Objective:   VITALS:   Filed Vitals:   01/31/15 1739 01/31/15 2118 02/01/15 0144 02/01/15 0616  BP: 148/89 158/86 151/84 147/79  Pulse: 100 112 110 112  Temp: 98.4 F (36.9 C) 98.4 F (36.9 C) 98.7 F (37.1 C) 99.2 F (37.3 C)  TempSrc: Oral Oral Oral Oral  Resp: 16 16 16 16   Height:      Weight:      SpO2: 100% 100% 100% 97%    Neurologically intact Neurovascular intact Sensation intact distally Intact pulses distally Dorsiflexion/Plantar flexion intact Incision: dressing C/D/I and no drainage No cellulitis present Compartment soft   Lab Results  Component Value Date   WBC 12.1* 02/01/2015   HGB 11.6* 02/01/2015   HCT 35.8* 02/01/2015   MCV 96.0 02/01/2015   PLT 169 02/01/2015     Assessment/Plan:  1 Day Post-Op   - Expected postop acute blood loss anemia - will monitor for symptoms - Up with PT/OT - DVT ppx - SCDs, ambulation, xarelto - WBAT right lower extremity - Pain control - Discharge planning  Marianna Payment 02/01/2015, 8:08 AM [phone]

## 2015-02-01 NOTE — Progress Notes (Signed)
Physical Therapy Treatment Patient Details Name: Charles [name] MRN: [id number] DOB: January 12, 1966 Today's Date: 02/01/2015    History of Present Illness R TKR; s/p L TKR 1 yr ago    PT Comments    Pt cooperative and motivated but ltd by pain and anxiety level.  Follow Up Recommendations  Home health PT     Equipment Recommendations  Rolling walker with 5" wheels    Recommendations for Other Services OT consult     Precautions / Restrictions Precautions Precautions: Knee;Fall Required Braces or Orthoses: Knee Immobilizer - Right Knee Immobilizer - Right: Discontinue once straight leg raise with < 10 degree lag Restrictions Weight Bearing Restrictions: No Other Position/Activity Restrictions: WBAT    Mobility  Bed Mobility Overal bed mobility: Needs Assistance Bed Mobility: Supine to Sit     Supine to sit: Min assist     General bed mobility comments: cues for sequence and use of L LE to self assist  Transfers Overall transfer level: Needs assistance Equipment used: Rolling walker (2 wheeled) Transfers: Sit to/from Stand Sit to Stand: Min assist         General transfer comment: cues for LE management and use of UEs to self assist  Ambulation/Gait Ambulation/Gait assistance: Min assist;Mod assist;+2 safety/equipment Ambulation Distance (Feet): 30 Feet Assistive device: Rolling walker (2 wheeled) Gait Pattern/deviations: Step-to pattern;Decreased step length - right;Decreased step length - left;Shuffle;Trunk flexed Gait velocity: decr   General Gait Details: cues for posture, position from RW and sequence   Stairs            Wheelchair Mobility    Modified Rankin (Stroke Patients Only)       Balance                                    Cognition Arousal/Alertness: Awake/alert Behavior During Therapy: WFL for tasks assessed/performed Overall Cognitive Status: Within Functional Limits for tasks assessed                       Exercises Total Joint Exercises Ankle Circles/Pumps: AROM;Both;15 reps;Supine Quad Sets: AROM;Both;10 reps;Supine Heel Slides: AAROM;Right Straight Leg Raises: AAROM;5 reps;Supine;Right    General Comments        Pertinent Vitals/Pain Pain Assessment: 0-10 Pain Score: 8  Pain Location: R knee Pain Descriptors / Indicators: Aching;Sore Pain Intervention(s): Limited activity within patient's tolerance;Monitored during session;RN gave pain meds during session;Patient requesting pain meds-RN notified;Ice applied    Home Living                      Prior Function            PT Goals (current goals can now be found in the care plan section) Acute Rehab PT Goals Patient Stated Goal: Resume previous active lifestyle with decreased pain PT Goal Formulation: With patient Time For Goal Achievement: 02/05/15 Potential to Achieve Goals: Good Progress towards PT goals: Progressing toward goals    Frequency  7X/week    PT Plan Current plan remains appropriate    Co-evaluation             End of Session Equipment Utilized During Treatment: Gait belt;Right knee immobilizer Activity Tolerance: Patient tolerated treatment well Patient left: in chair;with call bell/phone within reach;with nursing/sitter in room     Time: 1140-1220 PT Time Calculation (min) (ACUTE ONLY): 40 min  Charges:  $Gait Training: 8-22  mins $Therapeutic Exercise: 23-37 mins                    G Codes:      Deshaun Weisinger Feb 18, 2015, 1:05 PM

## 2015-02-02 LAB — CBC
HCT: 37.6 % — ABNORMAL LOW (ref 39.0–52.0)
HEMOGLOBIN: 12.4 g/dL — AB (ref 13.0–17.0)
MCH: 32.2 pg (ref 26.0–34.0)
MCHC: 33 g/dL (ref 30.0–36.0)
MCV: 97.7 fL (ref 78.0–100.0)
Platelets: 185 10*3/uL (ref 150–400)
RBC: 3.85 MIL/uL — ABNORMAL LOW (ref 4.22–5.81)
RDW: 12.3 % (ref 11.5–15.5)
WBC: 10.2 10*3/uL (ref 4.0–10.5)

## 2015-02-02 NOTE — Progress Notes (Signed)
   Subjective:  Patient reports pain as moderate.    Objective:   VITALS:   Filed Vitals:   02/01/15 1004 02/01/15 1706 02/01/15 2011 02/02/15 0511  BP: 150/81 149/91 158/94 142/80  Pulse: 103 107 115 105  Temp: 99 F (37.2 C) 98.8 F (37.1 C) 98.5 F (36.9 C) 99.7 F (37.6 C)  TempSrc: Oral Oral Oral Oral  Resp: 16 16 18 16   Height:      Weight:      SpO2: 100% 98% 100% 95%    Incision c/d/i   Lab Results  Component Value Date   WBC 10.2 02/02/2015   HGB 12.4* 02/02/2015   HCT 37.6* 02/02/2015   MCV 97.7 02/02/2015   PLT 185 02/02/2015     Assessment/Plan:  2 Days Post-Op   - continue PT - pain controlled - dressing changed - home monday  Marianna Payment 02/02/2015, 7:02 AM [phone]

## 2015-02-02 NOTE — Progress Notes (Signed)
Physical Therapy Treatment Patient Details Name: Charles [name] MRN: [id number] DOB: 06-03-1966 Today's Date: 02/02/2015    History of Present Illness R TKR; s/p L TKR 1 yr ago    PT Comments    Steady progress with mobility.  Pt ltd by elevated pain and anxiety but cooperative with PT  Follow Up Recommendations  Home health PT     Equipment Recommendations  Rolling walker with 5" wheels    Recommendations for Other Services OT consult     Precautions / Restrictions Precautions Precautions: Knee;Fall Required Braces or Orthoses: Knee Immobilizer - Right Knee Immobilizer - Right: Discontinue once straight leg raise with < 10 degree lag Restrictions Weight Bearing Restrictions: No Other Position/Activity Restrictions: WBAT    Mobility  Bed Mobility Overal bed mobility: Needs Assistance Bed Mobility: Supine to Sit;Sit to Supine     Supine to sit: Min assist Sit to supine: Min assist   General bed mobility comments: cues for sequence and use of L LE to self assist  Transfers Overall transfer level: Needs assistance Equipment used: Rolling walker (2 wheeled) Transfers: Sit to/from Stand Sit to Stand: Min guard         General transfer comment: cues for LE management and use of UEs to self assist  Ambulation/Gait Ambulation/Gait assistance: Min assist;Min guard Ambulation Distance (Feet): 148 Feet Assistive device: Rolling walker (2 wheeled) Gait Pattern/deviations: Step-to pattern;Decreased step length - right;Decreased step length - left;Shuffle;Trunk flexed Gait velocity: decr   General Gait Details: cues for posture, position from RW and sequence   Stairs            Wheelchair Mobility    Modified Rankin (Stroke Patients Only)       Balance                                    Cognition Arousal/Alertness: Awake/alert Behavior During Therapy: WFL for tasks assessed/performed Overall Cognitive Status: Within Functional  Limits for tasks assessed                      Exercises Total Joint Exercises Ankle Circles/Pumps: AROM;Both;15 reps;Supine Quad Sets: AROM;Both;Supine;20 reps Heel Slides: AAROM;Right;20 reps;Supine Straight Leg Raises: AAROM;Supine;Right;20 reps Goniometric ROM: AAROM R knee -10 - 50    General Comments        Pertinent Vitals/Pain Pain Assessment: 0-10 Pain Score: 7  Pain Location: R knee Pain Descriptors / Indicators: Aching;Sore Pain Intervention(s): Limited activity within patient's tolerance;Monitored during session;Premedicated before session;Ice applied;Patient requesting pain meds-RN notified    Home Living                      Prior Function            PT Goals (current goals can now be found in the care plan section) Acute Rehab PT Goals Patient Stated Goal: Resume previous active lifestyle with decreased pain PT Goal Formulation: With patient Time For Goal Achievement: 02/05/15 Potential to Achieve Goals: Good Progress towards PT goals: Progressing toward goals    Frequency  7X/week    PT Plan Current plan remains appropriate    Co-evaluation             End of Session Equipment Utilized During Treatment: Gait belt;Right knee immobilizer Activity Tolerance: Patient tolerated treatment well Patient left: in bed;with call bell/phone within reach     Time: 8299-3716 PT Time  Calculation (min) (ACUTE ONLY): 45 min  Charges:  $Gait Training: 23-37 mins $Therapeutic Exercise: 8-22 mins                    G Codes:      Charles [name] Feb 14, 2015, 4:54 PM

## 2015-02-02 NOTE — Progress Notes (Signed)
Pt with no urine output 6 hours past foley cath removal. Bladder scan showed greater than 600 ml. In and out cath performed. 700 ml of clear, pale yellow urine obtained. Vwilliams,rn.

## 2015-02-02 NOTE — Care Management Note (Signed)
Case Management Note  Patient Details  Name: Charles [name] MRN: [id number] Date of Birth: May 28, 1966  Subjective/Objective:                    Action/Plan:   Expected Discharge Date:  01/30/15               Expected Discharge Plan:  [place]  In-House Referral:     Discharge planning Services  CM Consult  Post Acute Care Choice:    Choice offered to:  Patient  DME Arranged:  Walker rolling DME Agency:  [place]:  PT [facility] Agency:  [place]  Status of Service:  Completed, signed off  Medicare Important Message Given:  No Date Medicare IM Given:    Medicare IM give by:    Date Additional Medicare IM Given:    Additional Medicare Important Message give by:     If discussed at [place] of Stay Meetings, dates discussed:    Additional Comments: NCM spoke to pt and he is requesting crutches also for home. NCM spoke to PT and they will order a set prior to dc. [facility] arranged for [facility]. [facility] delivered RW to room.  Erenest Rasher, RN 02/02/2015, 2:27 PM

## 2015-02-03 LAB — CBC
HCT: 38.2 % — ABNORMAL LOW (ref 39.0–52.0)
Hemoglobin: 12.9 g/dL — ABNORMAL LOW (ref 13.0–17.0)
MCH: 32.4 pg (ref 26.0–34.0)
MCHC: 33.8 g/dL (ref 30.0–36.0)
MCV: 96 fL (ref 78.0–100.0)
PLATELETS: 178 10*3/uL (ref 150–400)
RBC: 3.98 MIL/uL — ABNORMAL LOW (ref 4.22–5.81)
RDW: 12.1 % (ref 11.5–15.5)
WBC: 8.6 10*3/uL (ref 4.0–10.5)

## 2015-02-03 MED ORDER — RIVAROXABAN 10 MG PO TABS: 10.0000 mg | ORAL_TABLET | Freq: Every day | ORAL | Status: AC

## 2015-02-03 MED ORDER — RIVAROXABAN 10 MG PO TABS: 10.0000 mg | ORAL_TABLET | Freq: Every day | ORAL | Status: DC

## 2015-02-03 MED ORDER — OXYCODONE HCL 5 MG PO TABS: 5.0000 mg | ORAL_TABLET | ORAL | Status: DC | PRN

## 2015-02-03 NOTE — Progress Notes (Signed)
Subjective: 3 Days Post-Op Procedure(s) (LRB): RIGHT TOTAL KNEE ARTHROPLASTY (Right) Patient reports pain as moderate.    Objective: Vital signs in last 24 hours: Temp:  [98.8 F (37.1 C)-100.4 F (38 C)] 98.8 F (37.1 C) (05/09 0454) Pulse Rate:  [103-107] 107 (05/08 2152) Resp:  [16-20] 16 (05/09 0454) BP: (123-151)/(88-95) 123/88 mmHg (05/09 0454) SpO2:  [96 %-100 %] 96 % (05/09 0454)  Intake/Output from previous day: 05/08 0701 - 05/09 0700 In: 2039.7 [P.O.:1756; I.V.:283.7] Out: 1825 [Urine:1825] Intake/Output this shift:     Recent Labs  02/01/15 0410 02/02/15 0500 02/03/15 0458  HGB 11.6* 12.4* 12.9*    Recent Labs  02/02/15 0500 02/03/15 0458  WBC 10.2 8.6  RBC 3.85* 3.98*  HCT 37.6* 38.2*  PLT 185 178    Recent Labs  02/01/15 0410  NA 137  K 4.6  CL 100*  CO2 27  BUN 23*  CREATININE 0.86  GLUCOSE 120*  CALCIUM 8.4*   No results for input(s): LABPT, INR in the last 72 hours.  Sensation intact distally Intact pulses distally Dorsiflexion/Plantar flexion intact Incision: dressing C/D/I No cellulitis present Compartment soft  Assessment/Plan: 3 Days Post-Op Procedure(s) (LRB): RIGHT TOTAL KNEE ARTHROPLASTY (Right) Up with therapy Discharge home with home health today.  BLACKMAN,CHRISTOPHER Y 02/03/2015, 7:14 AM
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
## 2015-02-03 NOTE — Plan of Care (Signed)
Problem: Phase III Progression Outcomes Goal: Pain controlled on oral analgesia Outcome: Not Progressing Pt continues to require IV pain meds q 2-3 hours for pain control on POD # 3 R TKR. Constant calls for Ice, Repositioning, pain meds with Level 8-9 pain.

## 2015-02-09 ENCOUNTER — Encounter (HOSPITAL_COMMUNITY): Payer: Self-pay | Admitting: Emergency Medicine

## 2015-02-09 ENCOUNTER — Emergency Department (HOSPITAL_COMMUNITY)
Admission: EM | Admit: 2015-02-09 | Discharge: 2015-02-09 | Disposition: A | Discharge status: Home / Self Care | Payer: Medicaid Other | Attending: Emergency Medicine | Admitting: Emergency Medicine

## 2015-02-09 DIAGNOSIS — Z8619 Personal history of other infectious and parasitic diseases: Secondary | ICD-10-CM | POA: Insufficient documentation

## 2015-02-09 DIAGNOSIS — K219 Gastro-esophageal reflux disease without esophagitis: Secondary | ICD-10-CM | POA: Diagnosis not present

## 2015-02-09 DIAGNOSIS — Z720 Tobacco use: Secondary | ICD-10-CM | POA: Diagnosis not present

## 2015-02-09 DIAGNOSIS — Z79899 Other long term (current) drug therapy: Secondary | ICD-10-CM | POA: Insufficient documentation

## 2015-02-09 DIAGNOSIS — G43909 Migraine, unspecified, not intractable, without status migrainosus: Secondary | ICD-10-CM | POA: Diagnosis not present

## 2015-02-09 DIAGNOSIS — Z96651 Presence of right artificial knee joint: Secondary | ICD-10-CM | POA: Diagnosis not present

## 2015-02-09 DIAGNOSIS — Z872 Personal history of diseases of the skin and subcutaneous tissue: Secondary | ICD-10-CM | POA: Insufficient documentation

## 2015-02-09 DIAGNOSIS — I10 Essential (primary) hypertension: Secondary | ICD-10-CM | POA: Insufficient documentation

## 2015-02-09 DIAGNOSIS — Z86010 Personal history of colonic polyps: Secondary | ICD-10-CM | POA: Insufficient documentation

## 2015-02-09 DIAGNOSIS — M1990 Unspecified osteoarthritis, unspecified site: Secondary | ICD-10-CM | POA: Diagnosis not present

## 2015-02-09 DIAGNOSIS — G8918 Other acute postprocedural pain: Secondary | ICD-10-CM | POA: Diagnosis not present

## 2015-02-09 DIAGNOSIS — F419 Anxiety disorder, unspecified: Secondary | ICD-10-CM | POA: Insufficient documentation

## 2015-02-09 DIAGNOSIS — Z87448 Personal history of other diseases of urinary system: Secondary | ICD-10-CM | POA: Insufficient documentation

## 2015-02-09 DIAGNOSIS — G8929 Other chronic pain: Secondary | ICD-10-CM | POA: Insufficient documentation

## 2015-02-09 MED ORDER — HYDROMORPHONE HCL 2 MG/ML IJ SOLN
2.0000 mg | Freq: Once | INTRAMUSCULAR | Status: AC
Start: 2015-02-09 — End: 2015-02-09
  Administered 2015-02-09: 2 mg via INTRAMUSCULAR
  Filled 2015-02-09: qty 1

## 2015-02-09 MED ORDER — OXYCODONE HCL 5 MG PO TABS: 5.0000 mg | ORAL_TABLET | ORAL | Status: AC | PRN

## 2015-02-09 MED ORDER — OXYCODONE HCL ER 10 MG PO T12A: 10.0000 mg | EXTENDED_RELEASE_TABLET | Freq: Two times a day (BID) | ORAL | Status: AC

## 2015-02-09 MED ORDER — OXYCODONE HCL ER 10 MG PO T12A
10.0000 mg | EXTENDED_RELEASE_TABLET | Freq: Once | ORAL | Status: AC
Start: 2015-02-09 — End: 2015-02-09
  Administered 2015-02-09: 10 mg via ORAL
  Filled 2015-02-09: qty 1

## 2015-02-09 NOTE — Discharge Instructions (Signed)
Total Knee Replacement, Care After °Refer to this sheet in the next few weeks. These instructions provide you with information on caring for yourself after your procedure. Your health care provider also may give you specific instructions. Your treatment has been planned according to the most current medical practices, but problems sometimes occur. Call your health care provider if you have any problems or questions after your procedure. °HOME CARE INSTRUCTIONS  °· See a physical therapist as directed by your health care provider. °· Take medicines only as directed by your health care provider. °· Avoid lifting or driving until you are instructed otherwise. °· If you have been sent home with a continuous passive motion machine, use it as directed by your health care provider. °SEEK MEDICAL CARE IF: °· You have difficulty breathing. °· You have drainage, redness, swelling, or pain at your incision site. °· You have a bad smell coming from your incision site. °· You have persistent bleeding from your incision site. °· Your incision breaks open after sutures (stitches) or staples have been removed. °· You have a fever. °SEEK IMMEDIATE MEDICAL CARE IF:  °· You have a rash. °· You have pain or swelling in your calf or thigh. °· You have shortness of breath or chest pain. °· Your range of motion in your knee is decreasing rather than increasing. °MAKE SURE YOU:  °· Understand these instructions. °· Will watch your condition. °· Will get help right away if you are not doing well or get worse. °Document Released: 04/02/2005 Document Revised: 01/28/2014 Document Reviewed: 11/02/2011 °[employer]® Patient Information ©2015 [employer]. This information is not intended to replace advice given to you by your health care provider. Make sure you discuss any questions you have with your health care provider. ° °
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
## 2015-02-09 NOTE — ED Notes (Signed)
Pt had right knee replacement on May 6, pt states that he was given oxycodone 5mg  for pain. Pt is out and called doctor to get refill and states that he cant get refill until Monday.  Pt was told by [facility] worker that he needs to not call his doctor and go to hospital if he is unable to tolerate pain and having problems. Pt took last dose of meds this morning.

## 2015-02-19 ENCOUNTER — Ambulatory Visit: Payer: Medicaid Other | Attending: Orthopaedic Surgery | Admitting: Physical Therapy

## 2015-02-19 DIAGNOSIS — M25561 Pain in right knee: Secondary | ICD-10-CM | POA: Diagnosis present

## 2015-02-19 DIAGNOSIS — M6281 Muscle weakness (generalized): Secondary | ICD-10-CM | POA: Insufficient documentation

## 2015-02-19 DIAGNOSIS — M25661 Stiffness of right knee, not elsewhere classified: Secondary | ICD-10-CM | POA: Diagnosis not present

## 2015-02-19 NOTE — Patient Instructions (Signed)
Hip Flexion / Knee Extension: Straight-Leg Raise (Eccentric)  Lie on back. Lift leg with knee straight. Slowly lower leg for 3-5 seconds.  _10__ reps per set, 1-3___ sets per day, _7__ days per week. Lower like elevator, stopping at each floor.   Then do the same exercise with your foot rotated outward.  KNEE: Knee Hang - Prone   Lie on stomach. Place towel above knee; hang feet off surface. Keep feet straight. Hold 60seconds to 5 minutes. ___ reps per set, __2_ sets per day, 7___ days per week Add ___ lb weights to ankles.   Hamstring Stretch, Reclined (Strap, Doorframe)   Lengthen bottom leg on floor. Extend top leg along edge of doorframe or press foot up into yoga strap. Hold for 30_seconds. Repeat 3____ times each leg.   Quadriceps (Prone)   On stomach with sheet around ankles, knees together, hips down, pull heels toward bottom. Keep hips flat. Hold __30__ seconds. Repeat _3___ times. Do __2-3__ sessions per day. CAUTION: Stretch should be gentle, steady and slow.  Copyright  VHI. All rights reserved.   Madelyn Flavors, PT 02/19/2015 10:53 AM [facility] [address] [place], [place] [address] Phone # [phone] Fax [phone]
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
## 2015-03-03 ENCOUNTER — Other Ambulatory Visit: Payer: Self-pay | Admitting: Gastroenterology

## 2015-03-05 ENCOUNTER — Ambulatory Visit: Payer: Medicaid Other | Admitting: Physical Therapy

## 2015-03-12 ENCOUNTER — Ambulatory Visit: Payer: Medicaid Other | Admitting: Physical Therapy

## 2015-03-19 ENCOUNTER — Ambulatory Visit: Payer: Medicaid Other | Attending: Orthopaedic Surgery | Admitting: Physical Therapy

## 2015-03-19 ENCOUNTER — Encounter: Payer: Self-pay | Admitting: Physical Therapy

## 2015-03-19 DIAGNOSIS — M6281 Muscle weakness (generalized): Secondary | ICD-10-CM | POA: Diagnosis present

## 2015-03-19 DIAGNOSIS — M25661 Stiffness of right knee, not elsewhere classified: Secondary | ICD-10-CM | POA: Insufficient documentation

## 2015-03-19 DIAGNOSIS — M25561 Pain in right knee: Secondary | ICD-10-CM | POA: Insufficient documentation

## 2015-03-19 NOTE — Patient Instructions (Signed)
Hamstring Step 1   Straighten right knee.. Hold _30__ seconds. Relax knee by returning foot to start. Repeat __4_ times. Do 3x day  Copyright  VHI. All rights reserved.
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
## 2015-03-20 ENCOUNTER — Encounter: Payer: Self-pay | Admitting: Gastroenterology

## 2015-04-02 ENCOUNTER — Ambulatory Visit: Payer: Medicaid Other | Attending: Orthopaedic Surgery | Admitting: Physical Therapy

## 2015-04-09 ENCOUNTER — Ambulatory Visit: Payer: Medicaid Other | Admitting: Physical Therapy

## 2015-04-16 ENCOUNTER — Encounter: Payer: Medicaid Other | Admitting: Physical Therapy

## 2015-04-24 ENCOUNTER — Other Ambulatory Visit: Payer: Self-pay | Admitting: Gastroenterology

## 2015-05-25 ENCOUNTER — Other Ambulatory Visit: Payer: Self-pay | Admitting: Gastroenterology

## 2015-05-28 ENCOUNTER — Emergency Department (HOSPITAL_COMMUNITY): Payer: Medicaid Other

## 2015-05-28 ENCOUNTER — Emergency Department (HOSPITAL_COMMUNITY)
Admission: EM | Admit: 2015-05-28 | Discharge: 2015-05-28 | Disposition: A | Discharge status: Home / Self Care | Payer: Medicaid Other | Attending: Emergency Medicine | Admitting: Emergency Medicine

## 2015-05-28 ENCOUNTER — Encounter (HOSPITAL_COMMUNITY): Payer: Self-pay | Admitting: Emergency Medicine

## 2015-05-28 DIAGNOSIS — Y9289 Other specified places as the place of occurrence of the external cause: Secondary | ICD-10-CM | POA: Diagnosis not present

## 2015-05-28 DIAGNOSIS — Z86010 Personal history of colonic polyps: Secondary | ICD-10-CM | POA: Diagnosis not present

## 2015-05-28 DIAGNOSIS — Y9301 Activity, walking, marching and hiking: Secondary | ICD-10-CM | POA: Diagnosis not present

## 2015-05-28 DIAGNOSIS — Z8709 Personal history of other diseases of the respiratory system: Secondary | ICD-10-CM | POA: Diagnosis not present

## 2015-05-28 DIAGNOSIS — G43909 Migraine, unspecified, not intractable, without status migrainosus: Secondary | ICD-10-CM | POA: Diagnosis not present

## 2015-05-28 DIAGNOSIS — Z87438 Personal history of other diseases of male genital organs: Secondary | ICD-10-CM | POA: Diagnosis not present

## 2015-05-28 DIAGNOSIS — Z7901 Long term (current) use of anticoagulants: Secondary | ICD-10-CM | POA: Insufficient documentation

## 2015-05-28 DIAGNOSIS — S99912A Unspecified injury of left ankle, initial encounter: Secondary | ICD-10-CM | POA: Diagnosis present

## 2015-05-28 DIAGNOSIS — F419 Anxiety disorder, unspecified: Secondary | ICD-10-CM | POA: Insufficient documentation

## 2015-05-28 DIAGNOSIS — I10 Essential (primary) hypertension: Secondary | ICD-10-CM | POA: Diagnosis not present

## 2015-05-28 DIAGNOSIS — S91002A Unspecified open wound, left ankle, initial encounter: Secondary | ICD-10-CM | POA: Insufficient documentation

## 2015-05-28 DIAGNOSIS — Z79899 Other long term (current) drug therapy: Secondary | ICD-10-CM | POA: Insufficient documentation

## 2015-05-28 DIAGNOSIS — Z872 Personal history of diseases of the skin and subcutaneous tissue: Secondary | ICD-10-CM | POA: Diagnosis not present

## 2015-05-28 DIAGNOSIS — G8929 Other chronic pain: Secondary | ICD-10-CM | POA: Diagnosis not present

## 2015-05-28 DIAGNOSIS — Y998 Other external cause status: Secondary | ICD-10-CM | POA: Insufficient documentation

## 2015-05-28 DIAGNOSIS — S8262XA Displaced fracture of lateral malleolus of left fibula, initial encounter for closed fracture: Secondary | ICD-10-CM | POA: Insufficient documentation

## 2015-05-28 DIAGNOSIS — S93402A Sprain of unspecified ligament of left ankle, initial encounter: Secondary | ICD-10-CM | POA: Diagnosis not present

## 2015-05-28 DIAGNOSIS — M1990 Unspecified osteoarthritis, unspecified site: Secondary | ICD-10-CM | POA: Diagnosis not present

## 2015-05-28 DIAGNOSIS — Z8619 Personal history of other infectious and parasitic diseases: Secondary | ICD-10-CM | POA: Insufficient documentation

## 2015-05-28 DIAGNOSIS — K219 Gastro-esophageal reflux disease without esophagitis: Secondary | ICD-10-CM | POA: Insufficient documentation

## 2015-05-28 DIAGNOSIS — X58XXXA Exposure to other specified factors, initial encounter: Secondary | ICD-10-CM | POA: Diagnosis not present

## 2015-05-28 DIAGNOSIS — Z720 Tobacco use: Secondary | ICD-10-CM | POA: Diagnosis not present

## 2015-05-28 MED ORDER — IBUPROFEN 800 MG PO TABS
800.0000 mg | ORAL_TABLET | Freq: Once | ORAL | Status: AC
  Administered 2015-05-28: 800 mg via ORAL
  Filled 2015-05-28: qty 1

## 2015-05-28 NOTE — Discharge Instructions (Signed)
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
## 2015-05-28 NOTE — ED Notes (Signed)
Per pt, states he stepped off of porch wrong on Monday and twisted left ankle

## 2015-06-03 ENCOUNTER — Encounter: Payer: Self-pay | Admitting: Gastroenterology

## 2015-06-28 DEATH — deceased

## 2015-08-19 ENCOUNTER — Encounter: Payer: Medicaid Other | Admitting: Gastroenterology

## 2016-02-27 IMAGING — CR DG KNEE 1-2V PORT*L*
1 series · 2 of 2 positions shown · non-contrast
Comparison: None.

CLINICAL DATA: Left total knee replacement

EXAM:
PORTABLE LEFT KNEE - 1-2 VIEW

[Series 1: AP · left · 2 of 2 slices shown]
[im 1/2]
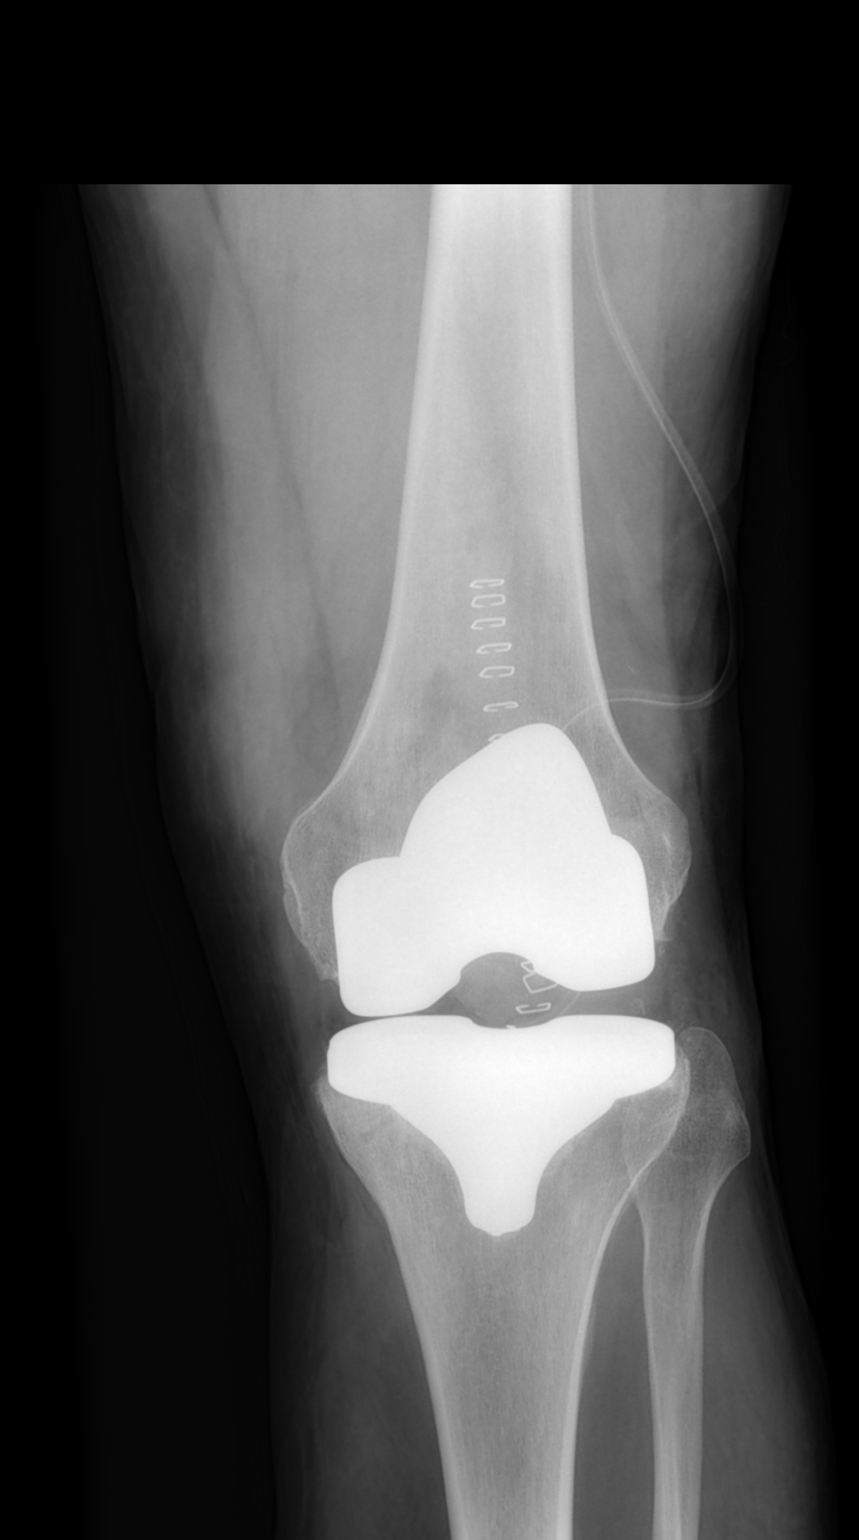
[im 2/2]
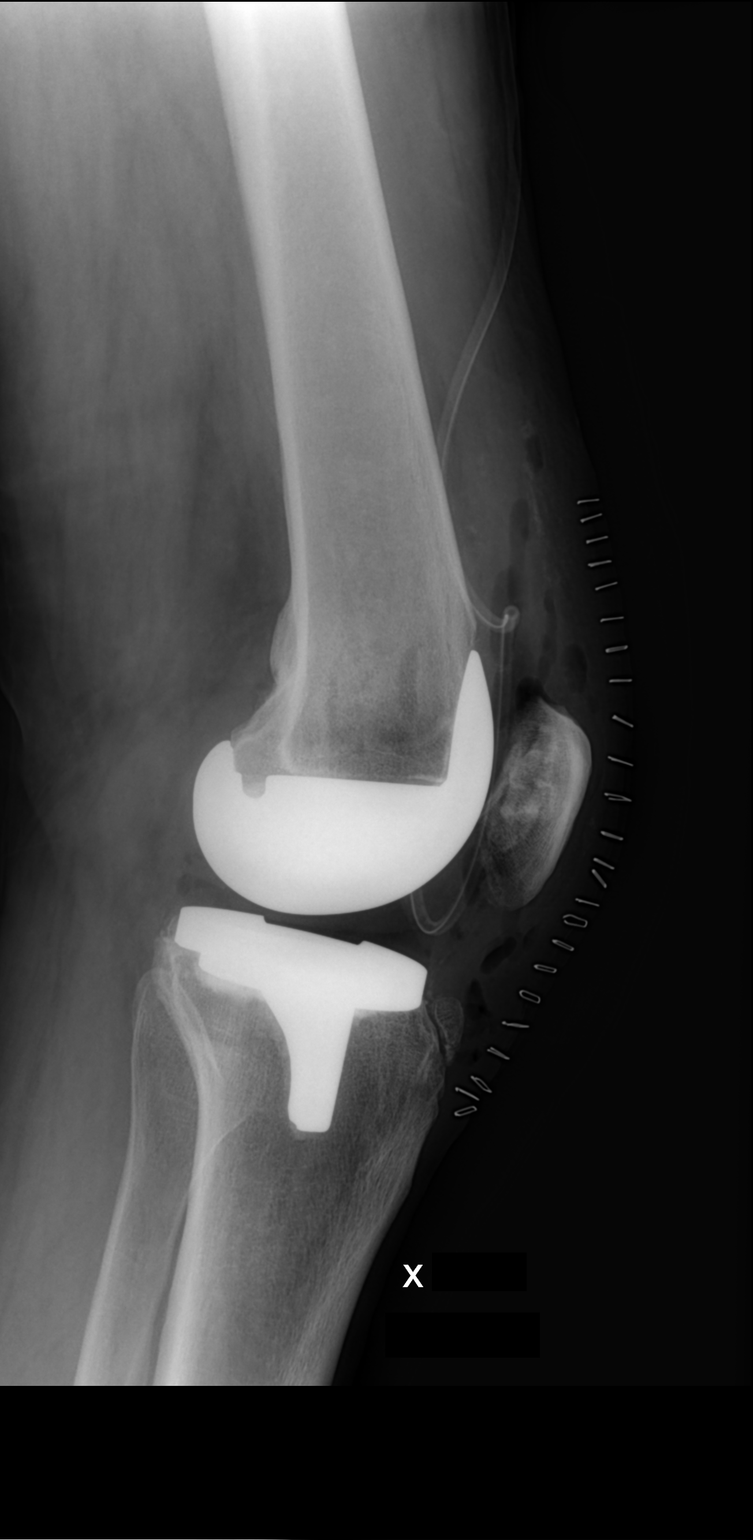

[2 of 2 positions shown; findings below may reference images not displayed]

FINDINGS: There is been total knee replacement. Components appear grossly well
positioned. Soft tissue drain in place in the suprapatellar region.
No evidence of retained foreign object or fracture.
IMPRESSION: Good appearance following total knee replacement. No unexpected
finding.

## 2016-10-14 IMAGING — CT CT ABD-PELV W/O CM
1 of 2 series · 15 of 32 positions shown, 19 images · non-contrast
Comparison: Multiple exams, including 06/03/2009

CLINICAL DATA: Bilateral flank pain. Urinary retention. Dark
coloration of the urine.

EXAM:
CT ABDOMEN AND PELVIS WITHOUT CONTRAST
TECHNIQUE: Multidetector CT imaging of the abdomen and pelvis was performed
following the standard protocol without IV contrast.

[Series 2: abd/pel w/o · axial · non-contrast · 0.85mm/px · z∈[-307,+88]mm · 15 of 87 slices shown, 19 images]
[im 4/87  soft-tissue]
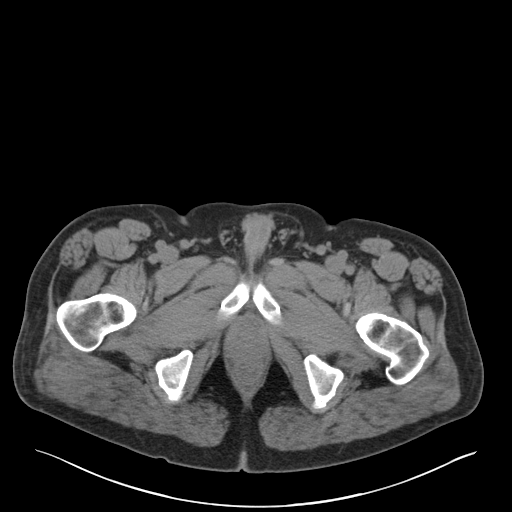
[im 4/87  bone]
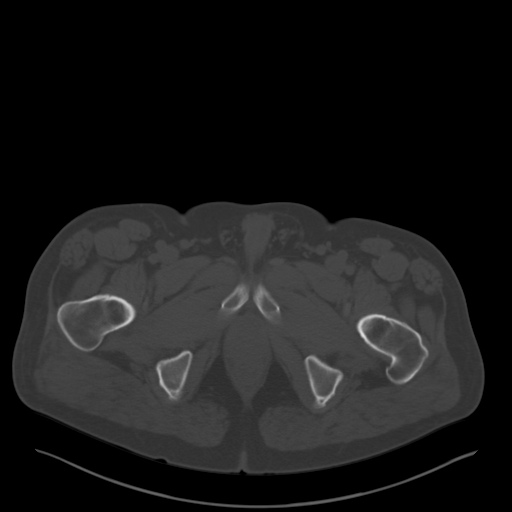
[im 12/87  soft-tissue]
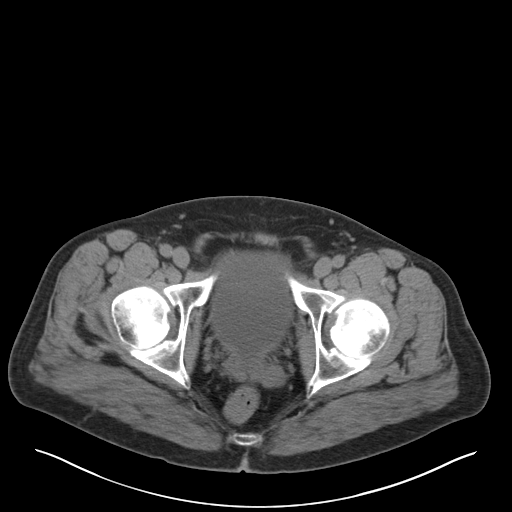
[im 19/87  soft-tissue]
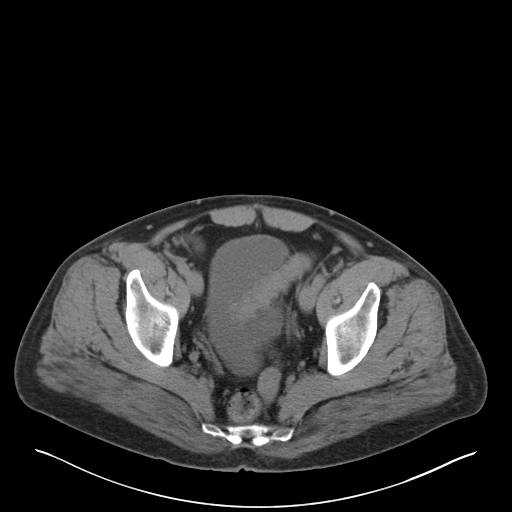
[im 23/87  soft-tissue]
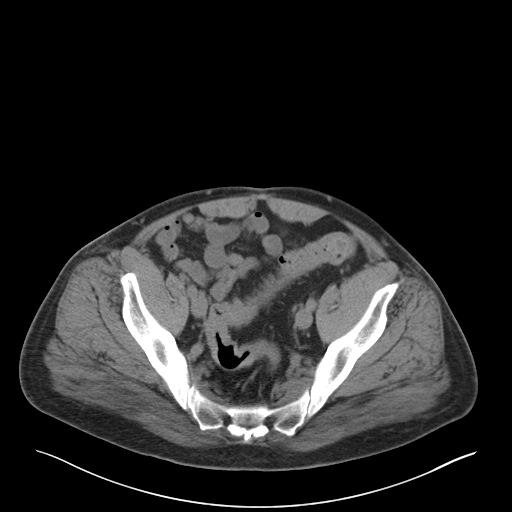
[im 30/87  soft-tissue]
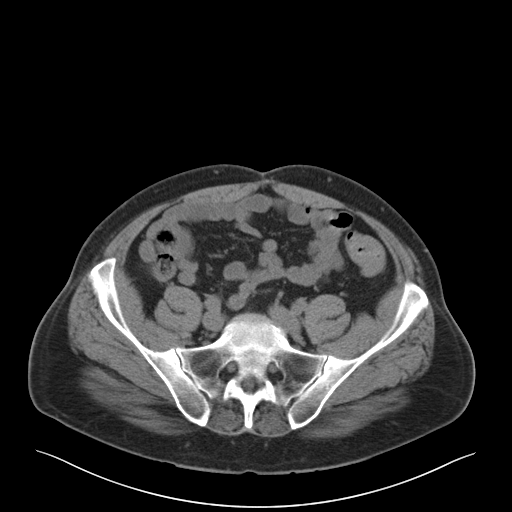
[im 38/87  soft-tissue]
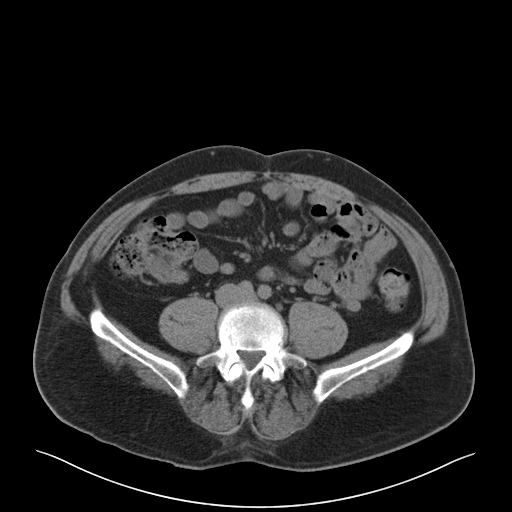
[im 45/87  soft-tissue]
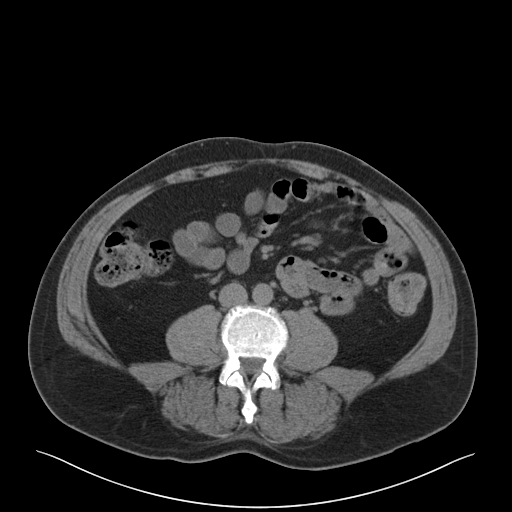
[im 49/87  soft-tissue]
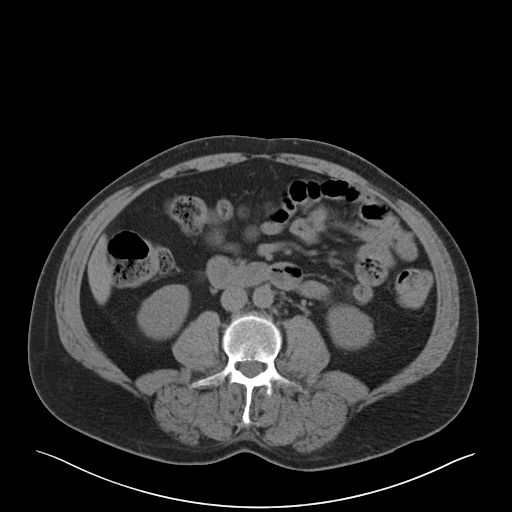
[im 57/87  soft-tissue]
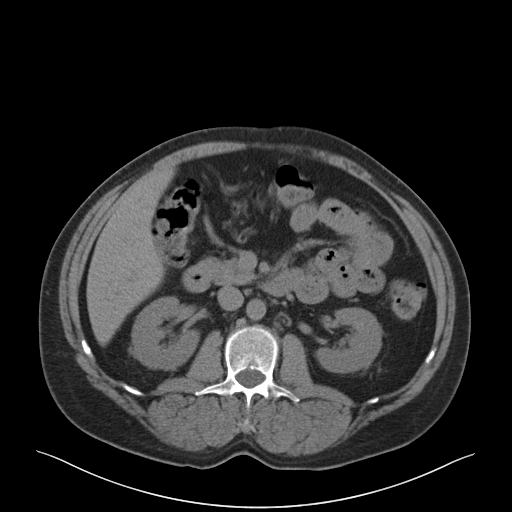
[im 57/87  bone]
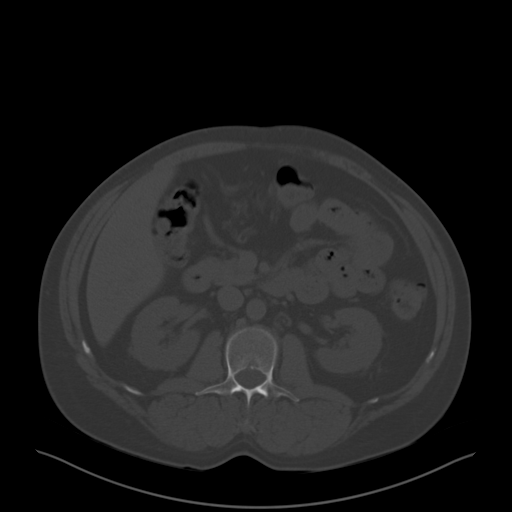
[im 64/87  soft-tissue]
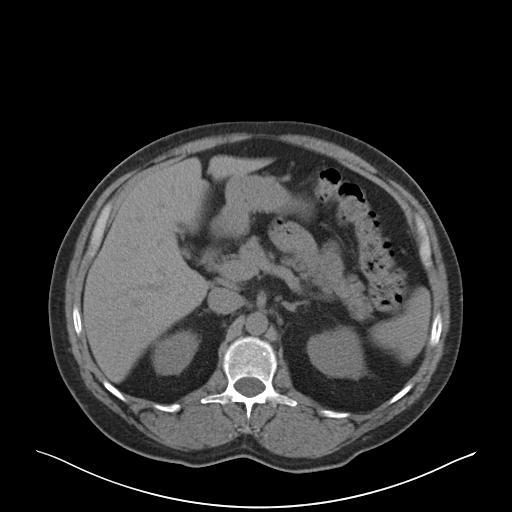
[im 68/87  soft-tissue]
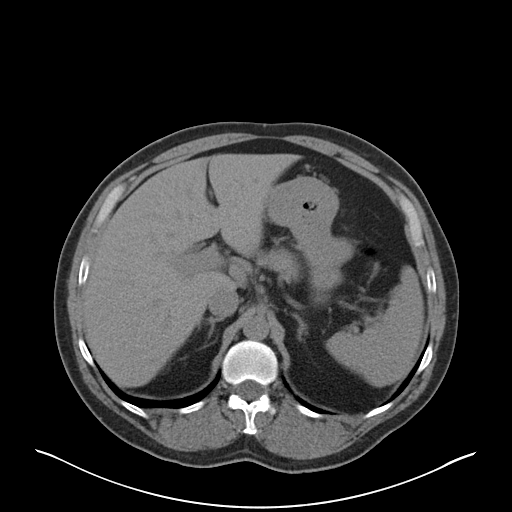
[im 72/87  lung]
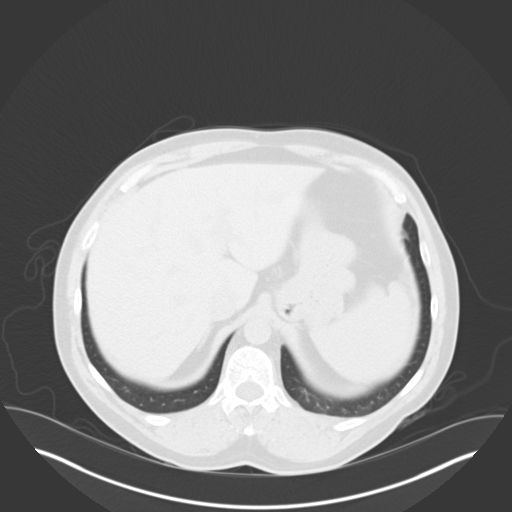
[im 75/87  soft-tissue]
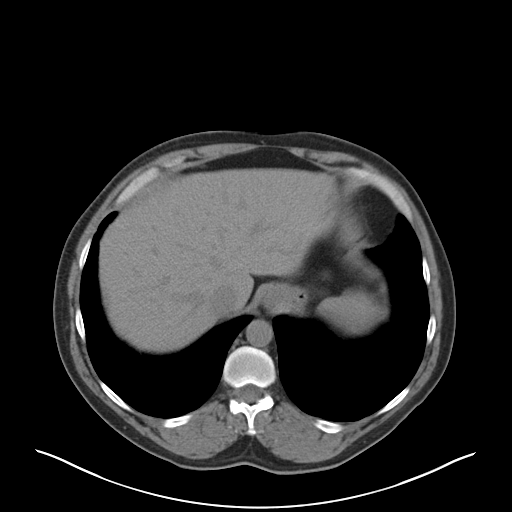
[im 75/87  lung]
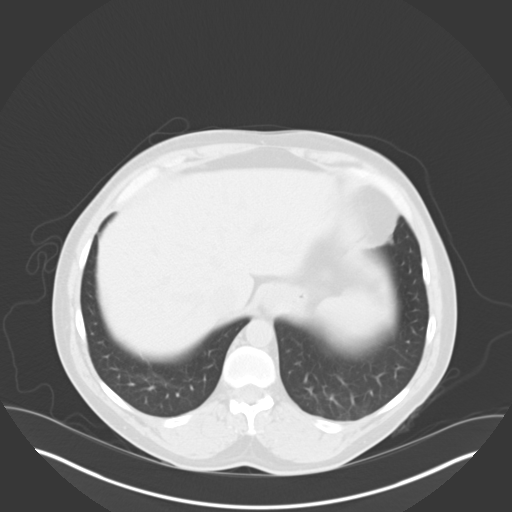
[im 79/87  lung]
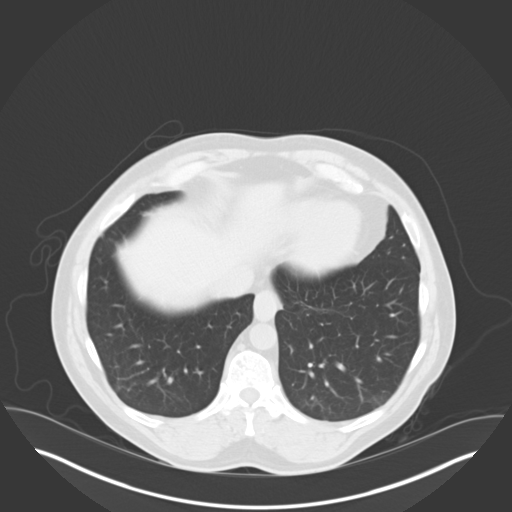
[im 83/87  soft-tissue]
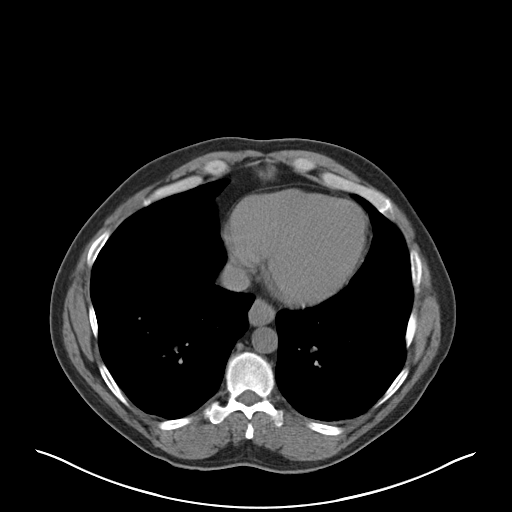
[im 83/87  lung]
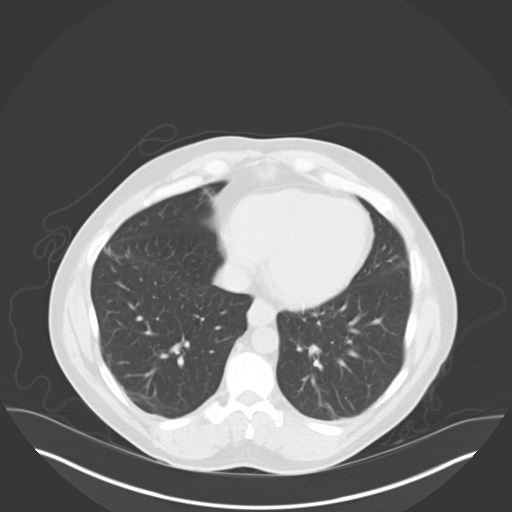

[15 of 32 positions shown; findings below may reference images not displayed]

FINDINGS: Lower chest:  Mild subsegmental atelectasis in both lower lobes.

Hepatobiliary: Contracted gallbladder.

Pancreas: Unremarkable

Spleen: Unremarkable

Adrenals/Urinary Tract: Unremarkable

Stomach/Bowel: Nondistended stomach likely accounting for the
lobulation of its contour and wall thickening. There is wall
thickening of the sigmoid colon and rectum.

Vascular/Lymphatic: Unremarkable

Reproductive: Size of the prostate gland is within normal limits.

Other: No supplemental non-categorized findings.

Musculoskeletal: Degenerative facet arthropathy at L5-S1 causing
mild bilateral foraminal stenosis. Fat density defect in the right
transverse abdominis and internal oblique muscles just above the
iliac crests, images 46-53 of series 2, compatible with hernia or
prior injury.
IMPRESSION: 1. Abnormal wall thickening in the sigmoid colon and rectum,
suspicious for colitis.
2. A specific noncontrast CT abnormality involving the urinary tract
is not observed.
